# Patient Record
Sex: Female | Born: 1955 | Race: White | Hispanic: No | Marital: Married | Smoking: Former smoker
Health system: Southern US, Community
[De-identification: ages and names within clinical notes are randomized; demographics above are authoritative.]

## PROBLEM LIST (undated history)

## (undated) DIAGNOSIS — I10 Essential (primary) hypertension: Secondary | ICD-10-CM

## (undated) DIAGNOSIS — I714 Abdominal aortic aneurysm, without rupture, unspecified: Secondary | ICD-10-CM

## (undated) DIAGNOSIS — T7840XA Allergy, unspecified, initial encounter: Secondary | ICD-10-CM

## (undated) DIAGNOSIS — I6529 Occlusion and stenosis of unspecified carotid artery: Secondary | ICD-10-CM

## (undated) DIAGNOSIS — I701 Atherosclerosis of renal artery: Secondary | ICD-10-CM

## (undated) HISTORY — DX: Essential (primary) hypertension: I10

## (undated) HISTORY — DX: Abdominal aortic aneurysm, without rupture, unspecified: I71.40

## (undated) HISTORY — PX: EYE SURGERY: SHX253

## (undated) HISTORY — DX: Abdominal aortic aneurysm, without rupture: I71.4

## (undated) HISTORY — PX: TUBAL LIGATION: SHX77

## (undated) HISTORY — DX: Allergy, unspecified, initial encounter: T78.40XA

## (undated) HISTORY — DX: Occlusion and stenosis of unspecified carotid artery: I65.29

## (undated) HISTORY — DX: Atherosclerosis of renal artery: I70.1

---

## 2000-04-01 ENCOUNTER — Ambulatory Visit (HOSPITAL_COMMUNITY): Admission: RE | Admit: 2000-04-01 | Discharge: 2000-04-01 | Payer: Self-pay

## 2002-07-30 ENCOUNTER — Emergency Department (HOSPITAL_COMMUNITY): Admission: EM | Admit: 2002-07-30 | Discharge: 2002-07-31 | Payer: Self-pay | Admitting: Emergency Medicine

## 2004-08-25 ENCOUNTER — Ambulatory Visit (HOSPITAL_COMMUNITY): Admission: RE | Admit: 2004-08-25 | Discharge: 2004-08-25 | Payer: Self-pay | Admitting: Obstetrics and Gynecology

## 2004-08-29 ENCOUNTER — Encounter: Admission: RE | Admit: 2004-08-29 | Discharge: 2004-08-29 | Payer: Self-pay | Admitting: Obstetrics and Gynecology

## 2005-12-04 ENCOUNTER — Encounter: Admission: RE | Admit: 2005-12-04 | Discharge: 2005-12-04 | Payer: Self-pay | Admitting: Obstetrics and Gynecology

## 2006-12-13 ENCOUNTER — Ambulatory Visit: Payer: Self-pay | Admitting: Gastroenterology

## 2006-12-17 ENCOUNTER — Encounter: Admission: RE | Admit: 2006-12-17 | Discharge: 2006-12-17 | Payer: Self-pay | Admitting: Obstetrics and Gynecology

## 2006-12-21 ENCOUNTER — Ambulatory Visit: Payer: Self-pay | Admitting: Gastroenterology

## 2006-12-24 ENCOUNTER — Encounter: Admission: RE | Admit: 2006-12-24 | Discharge: 2006-12-24 | Payer: Self-pay | Admitting: Obstetrics and Gynecology

## 2010-12-21 ENCOUNTER — Encounter: Payer: Self-pay | Admitting: Obstetrics and Gynecology

## 2011-01-01 ENCOUNTER — Other Ambulatory Visit: Payer: Self-pay | Admitting: Obstetrics and Gynecology

## 2011-01-01 DIAGNOSIS — M8580 Other specified disorders of bone density and structure, unspecified site: Secondary | ICD-10-CM

## 2011-01-01 DIAGNOSIS — Z1239 Encounter for other screening for malignant neoplasm of breast: Secondary | ICD-10-CM

## 2011-01-13 ENCOUNTER — Ambulatory Visit
Admission: RE | Admit: 2011-01-13 | Discharge: 2011-01-13 | Disposition: A | Discharge status: Home / Self Care | Payer: BC Managed Care – PPO | Source: Ambulatory Visit | Attending: Obstetrics and Gynecology | Admitting: Obstetrics and Gynecology

## 2011-01-13 DIAGNOSIS — M8580 Other specified disorders of bone density and structure, unspecified site: Secondary | ICD-10-CM

## 2011-01-13 DIAGNOSIS — Z1239 Encounter for other screening for malignant neoplasm of breast: Secondary | ICD-10-CM

## 2014-10-13 ENCOUNTER — Ambulatory Visit (INDEPENDENT_AMBULATORY_CARE_PROVIDER_SITE_OTHER): Payer: BC Managed Care – PPO | Admitting: Internal Medicine

## 2014-10-13 ENCOUNTER — Ambulatory Visit (INDEPENDENT_AMBULATORY_CARE_PROVIDER_SITE_OTHER): Payer: BC Managed Care – PPO

## 2014-10-13 ENCOUNTER — Ambulatory Visit: Payer: BC Managed Care – PPO

## 2014-10-13 VITALS — BP 170/98 | HR 86 | Temp 98.7°F | Resp 15 | Ht 65.5 in | Wt 130.0 lb

## 2014-10-13 DIAGNOSIS — S40012A Contusion of left shoulder, initial encounter: Secondary | ICD-10-CM

## 2014-10-13 DIAGNOSIS — M25551 Pain in right hip: Secondary | ICD-10-CM

## 2014-10-13 DIAGNOSIS — M79642 Pain in left hand: Secondary | ICD-10-CM

## 2014-10-13 DIAGNOSIS — S62305A Unspecified fracture of fourth metacarpal bone, left hand, initial encounter for closed fracture: Secondary | ICD-10-CM

## 2014-10-13 DIAGNOSIS — S0083XA Contusion of other part of head, initial encounter: Secondary | ICD-10-CM

## 2014-10-13 DIAGNOSIS — S8002XA Contusion of left knee, initial encounter: Secondary | ICD-10-CM

## 2014-10-13 DIAGNOSIS — M7071 Other bursitis of hip, right hip: Secondary | ICD-10-CM

## 2014-10-13 MED ORDER — MELOXICAM 15 MG PO TABS: 15.0000 mg | ORAL_TABLET | Freq: Every day | ORAL | Status: DC

## 2014-10-13 NOTE — Progress Notes (Signed)
Subjective:  This chart was scribed for Pamela ChickKristi M Smith, MD by Pamela Garrett, ED Scribe. This patient was seen in room Room/bed info not found and the patient's care was started 8:52 AM.   Patient ID: Pamela Garrett, female    DOB: [birth year], 57 y.o.   MRN: [id number]  HPI HPI Comments: Pamela Garrett is a 58 y.o. female who presents to the Emergency Department complaining of a fall that occurred last night. Pt reports she tripped over a speed bumb last night and complains of left hand and left sided facial pain. Pt reports associated swelling to her left hand where she states that she tried to catch herself when she fell. Pt complains of pain in her left knee and left shoulder following the fall, but is able to walk without pain. She reports no other symptoms or modifying factors at this time.   In addition she has right hip pain with walking and laying on that side for the past 8/9 months. This has been a recurrent problem for the past 8 to 10 years but has worsened recently. 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 The right hip does not radiate from the lumbar area but does radiate from the hip to the knee.   There are no active problems to display for this patient.  Allergies  Allergen Reactions  . Sulfa Antibiotics Hives   No current outpatient prescriptions on file prior to visit.   No current facility-administered medications on file prior to visit.   History   Social History  . Marital Status: Married    Spouse Name: N/A    Number of Children: N/A  . Years of Education: N/A   Occupational History  . Not on file.   Social History Main Topics  . Smoking status: Current Every Day Smoker -- 1.00 packs/day    Types: Cigarettes    Start date: 10/13/1973  . Smokeless tobacco: Never Used  . Alcohol Use: Not on file  . Drug Use: Not on file  .  Sexual Activity: Not on file   Other Topics Concern  . Not on file   Social History Narrative  . No narrative on file   Filed Vitals:   10/13/14 0843  BP: 170/98  Pulse: 86  Temp: 98.7 F (37.1 C)  Resp: 15    Past Surgical History  Procedure Laterality Date  . Tubal ligation    .  Review of Systems  Musculoskeletal: Positive for arthralgias.  Neurological: Negative for syncope.  All other systems reviewed and are negative.     Objective:   Physical Exam  Constitutional: She is oriented to person, place, and time. She appears well-developed and well-nourished. No distress.  No acute distress.   HENT:  Head: Normocephalic and atraumatic.  Eyes: Conjunctivae and EOM are normal. Pupils are equal, round, and reactive to light.  Neck: Neck supple.  Cardiovascular: Normal rate.   Pulmonary/Chest: Effort normal.  Musculoskeletal:  Ecchymosis over the left malar area but good movement of the jaw and full EOMs.   Left hand is swollen over the dorsum along the 4th metacarpal with pain on palpation and ROM. MCP somewhat receded, but normal rotation w/ flexion  Contusion with abrasion over the left infrapatellar area with slight bruising but the knee is stable to stressors and has a full ROM without effusion.  Left shoulder has contusion over the anterior aspect with minimal swelling with some pain on ROM but with full movement against resistance.   Right hip tender over the greater trochanter and into the lateral thigh to palpation. External rotation creates pain there is some tenderness over the right SI join. Straight leg raises negative to 90 degrees. No periphery or sensory losses on the right.   Neurological: She is alert and oriented to person, place, and time. No cranial nerve deficit.  Psychiatric: She has a normal mood and affect. Her behavior is normal.  Nursing note and vitals reviewed.   [facility] reading (PRIMARY) by  Pamela Garrett=?impacted fx 4th MC head without  significant angulation Hip=no degen changes//?what is Dense Ca++ body adjacent to iliac crest      Assessment & Plan:  Pain of left hand   Right hip pain - trochanteric bursitis with some iliotibial radiation of discomfort  meloxicam and stretching for 1 month//if not improving considering injections  Fracture of fourth metacarpal bone of left hand???  Splint with ulnar gutter and plan to see in clinic 104 next wednesday  Contusion of left shoulder, initial encounter  Contusion of face, initial encounter  Contusion of left knee, initial encounter      I have completed the patient encounter in its entirety as documented by the scribe, with editing by me where necessary. Pamela Garrett, M.D.

## 2014-10-15 ENCOUNTER — Encounter: Payer: Self-pay | Admitting: Physician Assistant

## 2014-10-15 NOTE — Progress Notes (Unsigned)
Patient presented with discomfort from the splint placed on the LEFT wrist.  New splint fashioned, requiring several attempts, for comfort.  She will follow-up with Dr. Merla Richesoolittle in 2 days as planned.

## 2014-10-17 ENCOUNTER — Encounter: Payer: Self-pay | Admitting: Family Medicine

## 2014-10-17 ENCOUNTER — Ambulatory Visit (INDEPENDENT_AMBULATORY_CARE_PROVIDER_SITE_OTHER): Payer: BC Managed Care – PPO | Admitting: Family Medicine

## 2014-10-17 ENCOUNTER — Ambulatory Visit (INDEPENDENT_AMBULATORY_CARE_PROVIDER_SITE_OTHER): Payer: BC Managed Care – PPO

## 2014-10-17 VITALS — BP 154/88 | HR 75 | Temp 98.6°F | Resp 16 | Ht 65.5 in | Wt 131.4 lb

## 2014-10-17 DIAGNOSIS — R8290 Unspecified abnormal findings in urine: Secondary | ICD-10-CM

## 2014-10-17 DIAGNOSIS — R8299 Other abnormal findings in urine: Secondary | ICD-10-CM

## 2014-10-17 DIAGNOSIS — S6992XD Unspecified injury of left wrist, hand and finger(s), subsequent encounter: Secondary | ICD-10-CM

## 2014-10-17 DIAGNOSIS — J3089 Other allergic rhinitis: Secondary | ICD-10-CM

## 2014-10-17 LAB — POCT URINALYSIS DIPSTICK
BILIRUBIN UA: NEGATIVE
Glucose, UA: NEGATIVE
Ketones, UA: 40
Nitrite, UA: POSITIVE
PROTEIN UA: NEGATIVE
Spec Grav, UA: 1.02
Urobilinogen, UA: 0.2
pH, UA: 5

## 2014-10-17 LAB — POCT UA - MICROSCOPIC ONLY
Casts, Ur, LPF, POC: NEGATIVE
Crystals, Ur, HPF, POC: NEGATIVE
Mucus, UA: POSITIVE
Yeast, UA: NEGATIVE

## 2014-10-17 MED ORDER — FLUTICASONE PROPIONATE 50 MCG/ACT NA SUSP: 2.0000 | Freq: Every day | NASAL | Status: DC

## 2014-10-17 NOTE — Patient Instructions (Addendum)
[facility] can see you on Tuesday Nov 24th (Dr Melvyn Novasrtmann) at 1 pm, arrive at 12:30.

## 2014-10-17 NOTE — Progress Notes (Signed)
Patient was placed in a new splint/ with her new diagnosis of base of 5th fracture, she should be splinted with her fingers in flexion. I placed her in a fiberglass splint with her wrist in neutral position and her fingers in flexion. There will be no charge for the supplies, as her previous splint could not be reused with fingers in extension.

## 2014-10-17 NOTE — Progress Notes (Signed)
Subjective:    Patient ID: Pamela Garrett, female    DOB: 10-04-1956, 58 y.o.   MRN: [id number]  HPI Patient presents today for follow up of left hand injury. Has been wearing splint every day and all night. Has noticed some redness in left thumb and forefinger. Has pain over lateral dorsum of hand. Pain not constant. Has not needed medication for pain. She has continued to work and uses her thumb and forefinger. Swelling has decreased considerably. Other injuries to left shoulder and left cheek and jaw are resolving  Has constant sinus headache and clear nasal drainage. Some relief with OTC zyrtec, but feels very dry. Takes [employer] Pow[employer] daily with some relief. Has never tried a nasal steroid.   [place] in [place] U[place] a couple of weeks ago and although her dysuria is resolved, her urine remains cloudy.    Review of Systems No fever, no chills, no dysuria/frequency/hematuria, no back pain. No ear pain.     Objective:   Physical Exam  Constitutional: She is oriented to person, place, and time. She appears well-developed and well-nourished.  HENT:  Head: Normocephalic and atraumatic.  Eyes: Conjunctivae are normal.  Neck: Normal range of motion. Neck supple.  Cardiovascular: Normal rate.   Pulmonary/Chest: Effort normal.  Musculoskeletal:       Left hand: She exhibits decreased range of motion, bony tenderness and swelling.  Left dorsum with mild swelling and resolving ecchymosis. Erythema on palm and thumb and forefinger. Decreased ROM of 4th finger.   Neurological: She is alert and oriented to person, place, and time.  Skin: Skin is warm.  Psychiatric: She has a normal mood and affect. Her behavior is normal. Judgment and thought content normal.  Vitals reviewed. BP 154/88 mmHg  Pulse 75  Temp(Src) 98.6 F (37 C) (Oral)  Resp 16  Ht 5' 5.5" (1.664 m)  Wt 131 lb 6.4 oz (59.603 kg)  BMI 21.53 kg/m2  SpO2 100%  [facility] reading (PRIMARY) by  Dr. Merla Richesoolittle- Left  hand compete- Suspect fracture at base of 5th MC, continue to suspect fracture of 4th MC.   POCT UA - Microscopic Only  Result Value Ref Range   WBC, Ur, HPF, POC 9-15    RBC, urine, microscopic 3-7    Bacteria, U Microscopic 3+    Mucus, UA pos    Epithelial cells, urine per micros 2-4    Crystals, Ur, HPF, POC neg    Casts, Ur, LPF, POC neg    Yeast, UA neg   POCT urinalysis dipstick  Result Value Ref Range   Color, UA yellow    Clarity, UA clear    Glucose, UA neg    Bilirubin, UA neg    Ketones, UA 40    Spec Grav, UA 1.020    Blood, UA large    pH, UA 5.0    Protein, UA neg    Urobilinogen, UA 0.2    Nitrite, UA pos    Leukocytes, UA Trace      Assessment & Plan:  1. Other allergic rhinitis - fluticasone (FLONASE) 50 MCG/ACT nasal spray; Place 2 sprays into both nostrils daily.  Dispense: 16 g; Refill: 6  2. Cloudy urine - POCT UA - Microscopic Only - POCT urinalysis dipstick - Urine with WBCs, Nitrite, Leukocytes- will check culture for sensitivity since patient recently on antibiotic, but not sure which one.  - Urine culture  3. Hand injury, left, subsequent encounter - DG Hand Complete Left; Future -  [facility] - New splint applied - Ambulatory referral to Orthopedic Surgery  Emi Belfasteborah B. Gessner, FNP-BC  [facility], [facility]  10/19/2014 11:01 PM

## 2014-10-20 LAB — URINE CULTURE

## 2014-10-21 ENCOUNTER — Other Ambulatory Visit: Payer: Self-pay | Admitting: Family Medicine

## 2014-10-21 DIAGNOSIS — N3090 Cystitis, unspecified without hematuria: Secondary | ICD-10-CM

## 2014-10-21 MED ORDER — NITROFURANTOIN MONOHYD MACRO 100 MG PO CAPS: 100.0000 mg | ORAL_CAPSULE | Freq: Two times a day (BID) | ORAL | Status: DC

## 2014-10-22 ENCOUNTER — Telehealth: Payer: Self-pay

## 2014-10-22 NOTE — Telephone Encounter (Signed)
Patient would like to know if she could get a note for work regarding her E. coli    [employer]#: [phone]

## 2014-10-22 NOTE — Telephone Encounter (Signed)
Pt advised her employer that she had E. Coli. Employer was concerned that pt was harboring a communicable disease. Note provided.

## 2014-11-14 ENCOUNTER — Encounter: Payer: Self-pay | Admitting: Family Medicine

## 2014-11-14 ENCOUNTER — Ambulatory Visit (INDEPENDENT_AMBULATORY_CARE_PROVIDER_SITE_OTHER): Payer: BC Managed Care – PPO | Admitting: Family Medicine

## 2014-11-14 VITALS — BP 155/84 | HR 83 | Temp 98.1°F | Resp 16 | Ht 65.25 in | Wt 127.0 lb

## 2014-11-14 DIAGNOSIS — R8290 Unspecified abnormal findings in urine: Secondary | ICD-10-CM

## 2014-11-14 DIAGNOSIS — N898 Other specified noninflammatory disorders of vagina: Secondary | ICD-10-CM

## 2014-11-14 DIAGNOSIS — M25551 Pain in right hip: Secondary | ICD-10-CM

## 2014-11-14 DIAGNOSIS — B9689 Other specified bacterial agents as the cause of diseases classified elsewhere: Secondary | ICD-10-CM

## 2014-11-14 DIAGNOSIS — N760 Acute vaginitis: Secondary | ICD-10-CM

## 2014-11-14 DIAGNOSIS — R634 Abnormal weight loss: Secondary | ICD-10-CM

## 2014-11-14 DIAGNOSIS — R8299 Other abnormal findings in urine: Secondary | ICD-10-CM

## 2014-11-14 DIAGNOSIS — A499 Bacterial infection, unspecified: Secondary | ICD-10-CM

## 2014-11-14 LAB — POCT URINALYSIS DIPSTICK
GLUCOSE UA: NEGATIVE
KETONES UA: NEGATIVE
LEUKOCYTES UA: NEGATIVE
Nitrite, UA: NEGATIVE
PROTEIN UA: NEGATIVE
Urobilinogen, UA: 1
pH, UA: 5

## 2014-11-14 LAB — POCT WET PREP WITH KOH
KOH Prep POC: NEGATIVE
RBC WET PREP PER HPF POC: NEGATIVE
TRICHOMONAS UA: NEGATIVE
YEAST WET PREP PER HPF POC: NEGATIVE

## 2014-11-14 LAB — POCT UA - MICROSCOPIC ONLY
BACTERIA, U MICROSCOPIC: NEGATIVE
CASTS, UR, LPF, POC: NEGATIVE
Crystals, Ur, HPF, POC: NEGATIVE
MUCUS UA: NEGATIVE
YEAST UA: NEGATIVE

## 2014-11-14 LAB — CBC
HEMATOCRIT: 39.9 % (ref 36.0–46.0)
Hemoglobin: 13.5 g/dL (ref 12.0–15.0)
MCH: 30.7 pg (ref 26.0–34.0)
MCHC: 33.8 g/dL (ref 30.0–36.0)
MCV: 90.7 fL (ref 78.0–100.0)
MPV: 10.7 fL (ref 9.4–12.4)
Platelets: 203 10*3/uL (ref 150–400)
RBC: 4.4 MIL/uL (ref 3.87–5.11)
RDW: 13.2 % (ref 11.5–15.5)
WBC: 5.5 10*3/uL (ref 4.0–10.5)

## 2014-11-14 MED ORDER — METRONIDAZOLE 0.75 % VA GEL: VAGINAL | Status: DC

## 2014-11-14 NOTE — Patient Instructions (Signed)
Take meloxicam daily Try heat and massage with gentle ROM  Hip Bursitis Bursitis is a swelling and soreness (inflammation) of a fluid-filled sac (bursa). This sac overlies and protects the joints.  CAUSES   Injury.  Overuse of the muscles surrounding the joint.  Arthritis.  Gout.  Infection.  Cold weather.  Inadequate warm-up and conditioning prior to activities. The cause may not be known.  SYMPTOMS   Mild to severe irritation.  Tenderness and swelling over the outside of the hip.  Pain with motion of the hip.  If the bursa becomes infected, a fever may be present. Redness, tenderness, and warmth will develop over the hip. Symptoms usually lessen in 3 to 4 weeks with treatment, but can come back. TREATMENT If conservative treatment does not work, your caregiver may advise draining the bursa and injecting cortisone into the area. This may speed up the healing process. This may also be used as an initial treatment of choice. HOME CARE INSTRUCTIONS   Apply ice to the affected area for 15-20 minutes every 3 to 4 hours while awake for the first 2 days. Put the ice in a plastic bag and place a towel between the bag of ice and your skin.  Rest the painful joint as much as possible, but continue to put the joint through a normal range of motion at least 4 times per day. When the pain lessens, begin normal, slow movements and usual activities to help prevent stiffness of the hip.  Only take over-the-counter or prescription medicines for pain, discomfort, or fever as directed by your caregiver.  Use crutches to limit weight bearing on the hip joint, if advised.  Elevate your painful hip to reduce swelling. Use pillows for propping and cushioning your legs and hips.  Gentle massage may provide comfort and decrease swelling. SEEK IMMEDIATE MEDICAL CARE IF:   Your pain increases even during treatment, or you are not improving.  You have a fever.  You have heat and inflammation  over the involved bursa.  You have any other questions or concerns. MAKE SURE YOU:   Understand these instructions.  Will watch your condition.  Will get help right away if you are not doing well or get worse. Document Released: 05/08/2002 Document Revised: 02/08/2012 Document Reviewed: 12/05/2008 [facility] Patient Information 2015 [place]. This information is not intended to replace advice given to you by your health care provider. Make sure you discuss any questions you have with your health care provider.

## 2014-11-14 NOTE — Progress Notes (Signed)
Subjective:    Patient ID: Pamela Garrett, female    DOB: [birth year], 58 y.o.   MRN: [id number]  HPI Patient presents today for follow up of UTI. She had finished her macrobid and then the next day, she had a red rash all over. She went to another UC and was given a steroid injection with resolution of rash.   Her urinary symptoms have resolved, but she wonders if she has a yeast infection. She took a Diflucan with some resolution of vaginal discharge, but continues to have some irritation.  She has had a 30 pound weight loss since this summer. She was initially trying to lose weight but has not been trying for the last 6 pounds. She reports decreased appetite, but eating regularly. She has some stress with her daughter and grandchildren living with her.   Her left hand fracture is healing, she is being followed by ortho and wearing a removable cast. Swelling and discoloration have decreased significantly.   She continues to have right hip pain. She was diagnosed with bursitis and was prescribed meloxicam for this, but never took it. Pain is worse with sitting. Pain into buttock and on side. No recent falls, no weakness, no radiation of pain down leg or up back. She is interested in an injection.   Review of Systems  Constitutional: Positive for appetite change and unexpected weight change. Negative for fever and chills.  Respiratory: Negative for cough, choking, chest tightness, shortness of breath and wheezing.   Cardiovascular: Negative for chest pain and leg swelling.  Gastrointestinal: Negative for nausea, vomiting, abdominal pain, diarrhea and constipation.  Genitourinary: Positive for vaginal discharge. Negative for dysuria, frequency and hematuria.  Musculoskeletal: Positive for arthralgias.      Objective:   Physical Exam  Constitutional: She is oriented to person, place, and time. She appears well-developed and well-nourished.  HENT:  Head: Normocephalic and atraumatic.    Eyes: Conjunctivae are normal.  Neck: Normal range of motion. Neck supple.  Cardiovascular: Normal rate, regular rhythm and normal heart sounds.   Pulmonary/Chest: Effort normal and breath sounds normal.  Abdominal: Soft. Bowel sounds are normal. She exhibits no distension and no mass. There is no tenderness. There is no rebound and no guarding.  Musculoskeletal: Normal range of motion. She exhibits tenderness. She exhibits no edema.       Right hip: She exhibits tenderness and bony tenderness. She exhibits normal range of motion, normal strength, no swelling, no crepitus and no deformity.  Pain with adduction, pain with palpation of gluteus maximus.  Neurological: She is alert and oriented to person, place, and time.  Skin: Skin is warm and dry.  Psychiatric: She has a normal mood and affect. Her behavior is normal. Judgment and thought content normal.  Vitals reviewed. BP 155/84 mmHg  Pulse 83  Temp(Src) 98.1 F (36.7 C) (Oral)  Resp 16  Ht 5' 5.25" (1.657 m)  Wt 127 lb (57.607 kg)  BMI 20.98 kg/m2  SpO2 98%    Assessment & Plan:  1. Vaginal discharge - POCT Wet Prep with KOH  2. Right hip pain - Encouraged patient to take meloxicam, use heat, massage. Offered her an ortho referral for possible injection, but patient reports that she has an appointment for follow up of her hand and will discuss then.   3. Loss of weight - CBC - Comprehensive metabolic panel - TSH - Follow up in 1 month  4. BV (bacterial vaginosis) - metroNIDAZOLE (METROGEL VAGINAL) 0.75 % vaginal  gel; 1 applicatorful in vagina at bedtime for 7 days.  Dispense: 70 g; Refill: 0  5. Cloudy urine - POCT UA - Microscopic Only - POCT urinalysis dipstick   Emi Belfasteborah B. Berkleigh Beckles, FNP-BC  [facility], [facility]  11/15/2014 4:56 PM

## 2014-11-15 LAB — COMPREHENSIVE METABOLIC PANEL
ALBUMIN: 4.2 g/dL (ref 3.5–5.2)
ALT: 13 U/L (ref 0–35)
AST: 12 U/L (ref 0–37)
Alkaline Phosphatase: 125 U/L — ABNORMAL HIGH (ref 39–117)
BUN: 19 mg/dL (ref 6–23)
CALCIUM: 9.4 mg/dL (ref 8.4–10.5)
CHLORIDE: 107 meq/L (ref 96–112)
CO2: 25 meq/L (ref 19–32)
Creat: 0.64 mg/dL (ref 0.50–1.10)
Glucose, Bld: 84 mg/dL (ref 70–99)
POTASSIUM: 4.1 meq/L (ref 3.5–5.3)
SODIUM: 138 meq/L (ref 135–145)
Total Bilirubin: 0.8 mg/dL (ref 0.2–1.2)
Total Protein: 6.5 g/dL (ref 6.0–8.3)

## 2014-11-15 LAB — TSH: TSH: 3.12 u[IU]/mL (ref 0.350–4.500)

## 2015-01-07 ENCOUNTER — Ambulatory Visit (INDEPENDENT_AMBULATORY_CARE_PROVIDER_SITE_OTHER): Payer: BC Managed Care – PPO | Admitting: Family Medicine

## 2015-01-07 ENCOUNTER — Encounter: Payer: Self-pay | Admitting: Family Medicine

## 2015-01-07 VITALS — BP 153/83 | HR 73 | Temp 98.3°F | Resp 16 | Ht 65.5 in | Wt 131.0 lb

## 2015-01-07 DIAGNOSIS — N898 Other specified noninflammatory disorders of vagina: Secondary | ICD-10-CM

## 2015-01-07 DIAGNOSIS — Z124 Encounter for screening for malignant neoplasm of cervix: Secondary | ICD-10-CM

## 2015-01-07 DIAGNOSIS — Z1239 Encounter for other screening for malignant neoplasm of breast: Secondary | ICD-10-CM

## 2015-01-07 DIAGNOSIS — Z0000 Encounter for general adult medical examination without abnormal findings: Secondary | ICD-10-CM

## 2015-01-07 DIAGNOSIS — F17200 Nicotine dependence, unspecified, uncomplicated: Secondary | ICD-10-CM

## 2015-01-07 DIAGNOSIS — I10 Essential (primary) hypertension: Secondary | ICD-10-CM

## 2015-01-07 DIAGNOSIS — R030 Elevated blood-pressure reading, without diagnosis of hypertension: Secondary | ICD-10-CM

## 2015-01-07 DIAGNOSIS — M8580 Other specified disorders of bone density and structure, unspecified site: Secondary | ICD-10-CM

## 2015-01-07 DIAGNOSIS — Z113 Encounter for screening for infections with a predominantly sexual mode of transmission: Secondary | ICD-10-CM

## 2015-01-07 DIAGNOSIS — IMO0001 Reserved for inherently not codable concepts without codable children: Secondary | ICD-10-CM

## 2015-01-07 DIAGNOSIS — Z87898 Personal history of other specified conditions: Secondary | ICD-10-CM

## 2015-01-07 DIAGNOSIS — Z720 Tobacco use: Secondary | ICD-10-CM

## 2015-01-07 DIAGNOSIS — Z9189 Other specified personal risk factors, not elsewhere classified: Secondary | ICD-10-CM

## 2015-01-07 LAB — POCT WET PREP WITH KOH
CLUE CELLS WET PREP PER HPF POC: NEGATIVE
KOH PREP POC: NEGATIVE
Trichomonas, UA: NEGATIVE
YEAST WET PREP PER HPF POC: NEGATIVE

## 2015-01-07 LAB — HEPATITIS C ANTIBODY: HCV AB: NEGATIVE

## 2015-01-07 LAB — RPR

## 2015-01-07 MED ORDER — HYDROCHLOROTHIAZIDE 25 MG PO TABS: 25.0000 mg | ORAL_TABLET | Freq: Every day | ORAL | Status: DC

## 2015-01-07 NOTE — Progress Notes (Signed)
Subjective:    Patient ID: Pamela Garrett, female    DOB: [birth year], 59 y.o.   MRN: [id number]  HPI Patient presents today for CPE. She has been doing well.   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 They should be moving out soon. 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 She has little time for herself. She dated some in the fall and requests STD testing today.   She has smoked 1ppd for many years. Is not currently interested in quitting.   Past Medical History  Diagnosis Date  . Allergy    Past Surgical History  Procedure Laterality Date  . Tubal ligation     No family history on file. History  Substance Use Topics  . Smoking status: Current Every Day Smoker -- 1.00 packs/day    Types: Cigarettes    Start date: 10/13/1973  . Smokeless tobacco: Never Used  . Alcohol Use: No    Review of Systems  Constitutional: Negative.   HENT: Negative.   Eyes: Negative.   Respiratory: Negative.   Cardiovascular: Negative.   Gastrointestinal: Negative.   Endocrine: Negative.   Genitourinary: Positive for vaginal discharge.  Musculoskeletal: Negative.   Skin: Negative.   Allergic/Immunologic: Negative.   Neurological: Negative.   Hematological: Negative.   Psychiatric/Behavioral: Negative.       Objective:   Physical Exam  Constitutional: She is oriented to person, place, and time. She appears well-developed and well-nourished.  HENT:  Head: Normocephalic and atraumatic.  Right Ear: External ear normal.  Left Ear: External ear normal.  Nose: Nose normal.  Mouth/Throat: Oropharynx is clear and moist. No oropharyngeal exudate.  Eyes: Conjunctivae are normal. Pupils are equal, round, and reactive to light.  Neck: Normal  range of motion. Neck supple. No thyromegaly present.  Cardiovascular: Normal rate, regular rhythm and normal heart sounds.   Pulmonary/Chest: Effort normal and breath sounds normal.  Abdominal: Soft. Bowel sounds are normal. She exhibits no distension and no mass. There is no tenderness. There is no rebound and no guarding. Hernia confirmed negative in the right inguinal area and confirmed negative in the left inguinal area.  Genitourinary: Uterus normal. No breast swelling, tenderness, discharge or bleeding. Pelvic exam was performed with patient supine. There is no rash, tenderness, lesion or injury on the right labia. There is no rash, tenderness, lesion or injury on the left labia. Cervix exhibits no motion tenderness, no discharge and no friability. Right adnexum displays no mass, no tenderness and no fullness. Left adnexum displays no mass, no tenderness and no fullness. No erythema, tenderness or bleeding in the vagina. Vaginal discharge (thin, white, nonodorous) found.  Musculoskeletal: Normal range of motion.  Lymphadenopathy:    She has no cervical adenopathy.       Right: No inguinal adenopathy present.       Left: No inguinal adenopathy present.  Neurological: She is alert and oriented to person, place, and time. She has normal reflexes.  Skin: Skin is warm and dry.  Psychiatric: She has a normal mood and affect. Her behavior is normal. Judgment and thought content normal.  Vitals reviewed.   BP 153/83 mmHg  Pulse 73  Temp(Src) 98.3 F (36.8 C) (Oral)  Resp 16  Ht  5' 5.5" (1.664 m)  Wt 131 lb (59.421 kg)  BMI 21.46 kg/m2  SpO2 99%  Wet prep- negative for yeast, BV, trich    Assessment & Plan:  1. Annual physical exam -patient had normal labs (CBC, CMET, TSH, UA) 11/14/14 -Discussed healthy habits, encouraged smoking cessation  2. Vaginal discharge - POCT Wet Prep with KOH- negative - encouraged natural fiber underwear, loose fitting clothing, soap without  fragrance  3. Essential hypertension - patient very reluctant to take medication. Discussed potential harm from untreated HTN - hydrochlorothiazide (HYDRODIURIL) 25 MG tablet; Take 1 tablet (25 mg total) by mouth daily.  Dispense: 90 tablet; Refill: 3 - Follow up BP check only in 2 weeks  4. Screening for cervical cancer - Pap IG, CT/NG w/ reflex HPV when ASC-U  5. Routine screening for STI (sexually transmitted infection) - HIV antibody - RPR - Hepatitis C antibody  6. At risk for bone density loss - DG Bone Density; Future  7. Screening for breast cancer - MM Digital Screening; Future   Emi Belfast, FNP-BC  [facility], [facility]  01/10/2015 1:54 PM

## 2015-01-07 NOTE — Patient Instructions (Signed)
Consider quitting smoking- you can do it! Pick a stop date, make a plan.  For high blood pressure- take 1/2 tablet daily for 1 week, then increase to 1 tablet. Come in for blood pressure check in 2 weeks- no appointment needed   Smoking Cessation Quitting smoking is important to your health and has many advantages. However, it is not always easy to quit since nicotine is a very addictive drug. Oftentimes, people try 3 times or more before being able to quit. This document explains the best ways for you to prepare to quit smoking. Quitting takes hard work and a lot of effort, but you can do it. ADVANTAGES OF QUITTING SMOKING  You will live longer, feel better, and live better.  Your body will feel the impact of quitting smoking almost immediately.  Within 20 minutes, blood pressure decreases. Your pulse returns to its normal level.  After 8 hours, carbon monoxide levels in the blood return to normal. Your oxygen level increases.  After 24 hours, the chance of having a heart attack starts to decrease. Your breath, hair, and body stop smelling like smoke.  After 48 hours, damaged nerve endings begin to recover. Your sense of taste and smell improve.  After 72 hours, the body is virtually free of nicotine. Your bronchial tubes relax and breathing becomes easier.  After 2 to 12 weeks, lungs can hold more air. Exercise becomes easier and circulation improves.  The risk of having a heart attack, stroke, cancer, or lung disease is greatly reduced.  After 1 year, the risk of coronary heart disease is cut in half.  After 5 years, the risk of stroke falls to the same as a nonsmoker.  After 10 years, the risk of lung cancer is cut in half and the risk of other cancers decreases significantly.  After 15 years, the risk of coronary heart disease drops, usually to the level of a nonsmoker.  If you are pregnant, quitting smoking will improve your chances of having a healthy baby.  The people  you live with, especially any children, will be healthier.  You will have extra money to spend on things other than cigarettes. QUESTIONS TO THINK ABOUT BEFORE ATTEMPTING TO QUIT You may want to talk about your answers with your health care provider.  Why do you want to quit?  If you tried to quit in the past, what helped and what did not?  What will be the most difficult situations for you after you quit? How will you plan to handle them?  Who can help you through the tough times? Your family? Friends? A health care provider?  What pleasures do you get from smoking? What ways can you still get pleasure if you quit? Here are some questions to ask your health care provider:  How can you help me to be successful at quitting?  What medicine do you think would be best for me and how should I take it?  What should I do if I need more help?  What is smoking withdrawal like? How can I get information on withdrawal? GET READY  Set a quit date.  Change your environment by getting rid of all cigarettes, ashtrays, matches, and lighters in your home, car, or work. Do not let people smoke in your home.  Review your past attempts to quit. Think about what worked and what did not. GET SUPPORT AND ENCOURAGEMENT You have a better chance of being successful if you have help. You can get support in  many ways.  Tell your family, friends, and coworkers that you are going to quit and need their support. Ask them not to smoke around you.  Get individual, group, or telephone counseling and support. Programs are available at [employer] and health centers. Call your local health department for information about programs in your area.  Spiritual beliefs and practices may help some smokers quit.  Download a "quit meter" on your computer to keep track of quit statistics, such as how long you have gone without smoking, cigarettes not smoked, and money saved.  Get a self-help book about quitting  smoking and staying off tobacco. LEARN NEW SKILLS AND BEHAVIORS  Distract yourself from urges to smoke. Talk to someone, go for a walk, or occupy your time with a task.  Change your normal routine. Take a different route to work. Drink tea instead of coffee. Eat breakfast in a different place.  Reduce your stress. Take a hot bath, exercise, or read a book.  Plan something enjoyable to do every day. Reward yourself for not smoking.  Explore interactive web-based programs that specialize in helping you quit. GET MEDICINE AND USE IT CORRECTLY Medicines can help you stop smoking and decrease the urge to smoke. Combining medicine with the above behavioral methods and support can greatly increase your chances of successfully quitting smoking.  Nicotine replacement therapy helps deliver nicotine to your body without the negative effects and risks of smoking. Nicotine replacement therapy includes nicotine gum, lozenges, inhalers, nasal sprays, and skin patches. Some may be available over-the-counter and others require a prescription.  Antidepressant medicine helps people abstain from smoking, but how this works is unknown. This medicine is available by prescription.  Nicotinic receptor partial agonist medicine simulates the effect of nicotine in your brain. This medicine is available by prescription. Ask your health care provider for advice about which medicines to use and how to use them based on your health history. Your health care provider will tell you what side effects to look out for if you choose to be on a medicine or therapy. Carefully read the information on the package. Do not use any other product containing nicotine while using a nicotine replacement product.  RELAPSE OR DIFFICULT SITUATIONS Most relapses occur within the first 3 months after quitting. Do not be discouraged if you start smoking again. Remember, most people try several times before finally quitting. You may have symptoms  of withdrawal because your body is used to nicotine. You may crave cigarettes, be irritable, feel very hungry, cough often, get headaches, or have difficulty concentrating. The withdrawal symptoms are only temporary. They are strongest when you first quit, but they will go away within 10-14 days. To reduce the chances of relapse, try to:  Avoid drinking alcohol. Drinking lowers your chances of successfully quitting.  Reduce the amount of caffeine you consume. Once you quit smoking, the amount of caffeine in your body increases and can give you symptoms, such as a rapid heartbeat, sweating, and anxiety.  Avoid smokers because they can make you want to smoke.  Do not let weight gain distract you. Many smokers will gain weight when they quit, usually less than 10 pounds. Eat a healthy diet and stay active. You can always lose the weight gained after you quit.  Find ways to improve your mood other than smoking. FOR MORE INFORMATION  www.smokefree.gov  Document Released: 11/10/2001 Document Revised: 04/02/2014 Document Reviewed: 02/25/2012 [facility] Patient Information 2015 [place], [place]. This information is not intended to  replace advice given to you by your health care provider. Make sure you discuss any questions you have with your health care provider. Hypertension Hypertension, commonly called high blood pressure, is when the force of blood pumping through your arteries is too strong. Your arteries are the blood vessels that carry blood from your heart throughout your body. A blood pressure reading consists of a higher number over a lower number, such as 110/72. The higher number (systolic) is the pressure inside your arteries when your heart pumps. The lower number (diastolic) is the pressure inside your arteries when your heart relaxes. Ideally you want your blood pressure below 120/80. Hypertension forces your heart to work harder to pump blood. Your arteries may become narrow or stiff. Having  hypertension puts you at risk for heart disease, stroke, and other problems.  RISK FACTORS Some risk factors for high blood pressure are controllable. Others are not.  Risk factors you cannot control include:   Race. You may be at higher risk if you are African American.  Age. Risk increases with age.  Gender. Men are at higher risk than women before age 19 years. After age 65, women are at higher risk than men. Risk factors you can control include:  Not getting enough exercise or physical activity.  Being overweight.  Getting too much fat, sugar, calories, or salt in your diet.  Drinking too much alcohol. SIGNS AND SYMPTOMS Hypertension does not usually cause signs or symptoms. Extremely high blood pressure (hypertensive crisis) may cause headache, anxiety, shortness of breath, and nosebleed. DIAGNOSIS  To check if you have hypertension, your health care provider will measure your blood pressure while you are seated, with your arm held at the level of your heart. It should be measured at least twice using the same arm. Certain conditions can cause a difference in blood pressure between your right and left arms. A blood pressure reading that is higher than normal on one occasion does not mean that you need treatment. If one blood pressure reading is high, ask your health care provider about having it checked again. TREATMENT  Treating high blood pressure includes making lifestyle changes and possibly taking medicine. Living a healthy lifestyle can help lower high blood pressure. You may need to change some of your habits. Lifestyle changes may include:  Following the DASH diet. This diet is high in fruits, vegetables, and whole grains. It is low in salt, red meat, and added sugars.  Getting at least 2 hours of brisk physical activity every week.  Losing weight if necessary.  Not smoking.  Limiting alcoholic beverages.  Learning ways to reduce stress. If lifestyle changes are  not enough to get your blood pressure under control, your health care provider may prescribe medicine. You may need to take more than one. Work closely with your health care provider to understand the risks and benefits. HOME CARE INSTRUCTIONS  Have your blood pressure rechecked as directed by your health care provider.   Take medicines only as directed by your health care provider. Follow the directions carefully. Blood pressure medicines must be taken as prescribed. The medicine does not work as well when you skip doses. Skipping doses also puts you at risk for problems.   Do not smoke.   Monitor your blood pressure at home as directed by your health care provider. SEEK MEDICAL CARE IF:   You think you are having a reaction to medicines taken.  You have recurrent headaches or feel dizzy.  You have  swelling in your ankles.  You have trouble with your vision. SEEK IMMEDIATE MEDICAL CARE IF:  You develop a severe headache or confusion.  You have unusual weakness, numbness, or feel faint.  You have severe chest or abdominal pain.  You vomit repeatedly.  You have trouble breathing. MAKE SURE YOU:   Understand these instructions.  Will watch your condition.  Will get help right away if you are not doing well or get worse. Document Released: 11/16/2005 Document Revised: 04/02/2014 Document Reviewed: 09/08/2013 [facility] Patient Information 2015 [place]. This information is not intended to replace advice given to you by your health care provider. Make sure you discuss any questions you have with your health care provider.

## 2015-01-08 ENCOUNTER — Encounter: Payer: Self-pay | Admitting: Family Medicine

## 2015-01-08 ENCOUNTER — Telehealth: Payer: Self-pay

## 2015-01-08 LAB — PAP IG, CT-NG, RFX HPV ASCU
CHLAMYDIA PROBE AMP: NEGATIVE
GC Probe Amp: NEGATIVE

## 2015-01-08 LAB — HIV ANTIBODY (ROUTINE TESTING W REFLEX): HIV 1&2 Ab, 4th Generation: NONREACTIVE

## 2015-01-08 NOTE — Telephone Encounter (Signed)
Pt would like to know if she should still take the bp medication prescribed by Deboraha Sprangebbie Gessner, she states that her bp was taken today and had come down to 134/84. Best# [phone]

## 2015-01-10 DIAGNOSIS — IMO0001 Reserved for inherently not codable concepts without codable children: Secondary | ICD-10-CM | POA: Insufficient documentation

## 2015-01-10 DIAGNOSIS — F17200 Nicotine dependence, unspecified, uncomplicated: Secondary | ICD-10-CM | POA: Insufficient documentation

## 2015-01-10 DIAGNOSIS — R030 Elevated blood-pressure reading, without diagnosis of hypertension: Secondary | ICD-10-CM

## 2015-01-17 NOTE — Addendum Note (Signed)
Addended by: Johnnette LitterARDWELL, Greogry Goodwyn M on: 01/17/2015 09:45 AM   Modules accepted: Orders, SmartSet

## 2015-02-01 ENCOUNTER — Encounter: Payer: Self-pay | Admitting: Family Medicine

## 2016-06-12 DIAGNOSIS — R319 Hematuria, unspecified: Secondary | ICD-10-CM | POA: Insufficient documentation

## 2016-06-12 DIAGNOSIS — R399 Unspecified symptoms and signs involving the genitourinary system: Secondary | ICD-10-CM | POA: Insufficient documentation

## 2016-06-12 DIAGNOSIS — N898 Other specified noninflammatory disorders of vagina: Secondary | ICD-10-CM | POA: Insufficient documentation

## 2016-10-01 ENCOUNTER — Encounter: Payer: Self-pay | Admitting: Gastroenterology

## 2016-10-21 ENCOUNTER — Encounter: Payer: Self-pay | Admitting: Gastroenterology

## 2016-12-01 DIAGNOSIS — R030 Elevated blood-pressure reading, without diagnosis of hypertension: Secondary | ICD-10-CM | POA: Insufficient documentation

## 2016-12-01 DIAGNOSIS — R05 Cough: Secondary | ICD-10-CM | POA: Insufficient documentation

## 2016-12-01 DIAGNOSIS — J329 Chronic sinusitis, unspecified: Secondary | ICD-10-CM | POA: Insufficient documentation

## 2016-12-01 DIAGNOSIS — R059 Cough, unspecified: Secondary | ICD-10-CM | POA: Insufficient documentation

## 2016-12-11 DIAGNOSIS — B373 Candidiasis of vulva and vagina: Secondary | ICD-10-CM | POA: Insufficient documentation

## 2016-12-11 DIAGNOSIS — B3731 Acute candidiasis of vulva and vagina: Secondary | ICD-10-CM | POA: Insufficient documentation

## 2017-06-03 DIAGNOSIS — R198 Other specified symptoms and signs involving the digestive system and abdomen: Secondary | ICD-10-CM | POA: Insufficient documentation

## 2017-06-03 DIAGNOSIS — R1013 Epigastric pain: Secondary | ICD-10-CM | POA: Insufficient documentation

## 2017-06-03 DIAGNOSIS — R194 Change in bowel habit: Secondary | ICD-10-CM | POA: Insufficient documentation

## 2017-06-03 DIAGNOSIS — K582 Mixed irritable bowel syndrome: Secondary | ICD-10-CM | POA: Insufficient documentation

## 2017-06-03 DIAGNOSIS — R0989 Other specified symptoms and signs involving the circulatory and respiratory systems: Secondary | ICD-10-CM | POA: Insufficient documentation

## 2017-07-05 DIAGNOSIS — I10 Essential (primary) hypertension: Secondary | ICD-10-CM | POA: Insufficient documentation

## 2017-07-05 DIAGNOSIS — I70203 Unspecified atherosclerosis of native arteries of extremities, bilateral legs: Secondary | ICD-10-CM | POA: Insufficient documentation

## 2017-07-05 DIAGNOSIS — I714 Abdominal aortic aneurysm, without rupture, unspecified: Secondary | ICD-10-CM | POA: Insufficient documentation

## 2017-07-05 DIAGNOSIS — R0989 Other specified symptoms and signs involving the circulatory and respiratory systems: Secondary | ICD-10-CM | POA: Insufficient documentation

## 2017-07-05 DIAGNOSIS — I701 Atherosclerosis of renal artery: Secondary | ICD-10-CM | POA: Insufficient documentation

## 2018-01-28 ENCOUNTER — Other Ambulatory Visit: Payer: Self-pay

## 2018-01-28 ENCOUNTER — Encounter (HOSPITAL_BASED_OUTPATIENT_CLINIC_OR_DEPARTMENT_OTHER): Payer: Self-pay | Admitting: Emergency Medicine

## 2018-01-28 ENCOUNTER — Emergency Department (HOSPITAL_BASED_OUTPATIENT_CLINIC_OR_DEPARTMENT_OTHER)
Admission: EM | Admit: 2018-01-28 | Discharge: 2018-01-28 | Disposition: A | Discharge status: Home / Self Care | Payer: BC Managed Care – PPO | Attending: Emergency Medicine | Admitting: Emergency Medicine

## 2018-01-28 ENCOUNTER — Emergency Department (HOSPITAL_BASED_OUTPATIENT_CLINIC_OR_DEPARTMENT_OTHER): Payer: BC Managed Care – PPO

## 2018-01-28 DIAGNOSIS — R51 Headache: Secondary | ICD-10-CM | POA: Insufficient documentation

## 2018-01-28 DIAGNOSIS — I160 Hypertensive urgency: Secondary | ICD-10-CM

## 2018-01-28 DIAGNOSIS — J0100 Acute maxillary sinusitis, unspecified: Secondary | ICD-10-CM

## 2018-01-28 DIAGNOSIS — Z79899 Other long term (current) drug therapy: Secondary | ICD-10-CM | POA: Insufficient documentation

## 2018-01-28 DIAGNOSIS — S62650A Nondisplaced fracture of medial phalanx of right index finger, initial encounter for closed fracture: Secondary | ICD-10-CM | POA: Diagnosis not present

## 2018-01-28 DIAGNOSIS — H538 Other visual disturbances: Secondary | ICD-10-CM | POA: Diagnosis not present

## 2018-01-28 DIAGNOSIS — I10 Essential (primary) hypertension: Secondary | ICD-10-CM | POA: Diagnosis present

## 2018-01-28 DIAGNOSIS — Y998 Other external cause status: Secondary | ICD-10-CM | POA: Diagnosis not present

## 2018-01-28 DIAGNOSIS — Y929 Unspecified place or not applicable: Secondary | ICD-10-CM | POA: Diagnosis not present

## 2018-01-28 DIAGNOSIS — X58XXXA Exposure to other specified factors, initial encounter: Secondary | ICD-10-CM | POA: Diagnosis not present

## 2018-01-28 DIAGNOSIS — Z87891 Personal history of nicotine dependence: Secondary | ICD-10-CM | POA: Diagnosis not present

## 2018-01-28 DIAGNOSIS — Y939 Activity, unspecified: Secondary | ICD-10-CM | POA: Diagnosis not present

## 2018-01-28 LAB — URINALYSIS, ROUTINE W REFLEX MICROSCOPIC
Bilirubin Urine: NEGATIVE
Glucose, UA: NEGATIVE mg/dL
Ketones, ur: NEGATIVE mg/dL
Nitrite: NEGATIVE
Protein, ur: NEGATIVE mg/dL
Specific Gravity, Urine: 1.02 (ref 1.005–1.030)
pH: 7.5 (ref 5.0–8.0)

## 2018-01-28 LAB — BASIC METABOLIC PANEL
Anion gap: 9 (ref 5–15)
BUN: 15 mg/dL (ref 6–20)
CO2: 25 mmol/L (ref 22–32)
Calcium: 9.2 mg/dL (ref 8.9–10.3)
Chloride: 106 mmol/L (ref 101–111)
Creatinine, Ser: 0.72 mg/dL (ref 0.44–1.00)
GFR calc Af Amer: >60 mL/min (ref >60)
GFR calc non Af Amer: >60 mL/min (ref >60)
Glucose, Bld: 93 mg/dL (ref 65–99)
Potassium: 3.6 mmol/L (ref 3.5–5.1)
Sodium: 140 mmol/L (ref 135–145)

## 2018-01-28 LAB — CBC WITH DIFFERENTIAL/PLATELET
Basophils Absolute: 0 10*3/uL (ref 0.0–0.1)
Basophils Relative: 1 %
Eosinophils Absolute: 0.1 10*3/uL (ref 0.0–0.7)
Eosinophils Relative: 2 %
HCT: 40.1 % (ref 36.0–46.0)
Hemoglobin: 13.4 g/dL (ref 12.0–15.0)
Lymphocytes Relative: 32 %
Lymphs Abs: 1.4 10*3/uL (ref 0.7–4.0)
MCH: 30.9 pg (ref 26.0–34.0)
MCHC: 33.4 g/dL (ref 30.0–36.0)
MCV: 92.6 fL (ref 78.0–100.0)
Monocytes Absolute: 0.5 10*3/uL (ref 0.1–1.0)
Monocytes Relative: 12 %
Neutro Abs: 2.4 10*3/uL (ref 1.7–7.7)
Neutrophils Relative %: 53 %
Platelets: 202 10*3/uL (ref 150–400)
RBC: 4.33 MIL/uL (ref 3.87–5.11)
RDW: 13 % (ref 11.5–15.5)
WBC: 4.4 10*3/uL (ref 4.0–10.5)

## 2018-01-28 LAB — URINALYSIS, MICROSCOPIC (REFLEX)

## 2018-01-28 MED ORDER — ACETAMINOPHEN 325 MG PO TABS: 650.0000 mg | ORAL_TABLET | Freq: Once | ORAL | Status: DC

## 2018-01-28 MED ORDER — FLUCONAZOLE 150 MG PO TABS: 150.0000 mg | ORAL_TABLET | Freq: Once | ORAL | 0 refills | Status: AC

## 2018-01-28 MED ORDER — AMOXICILLIN-POT CLAVULANATE 875-125 MG PO TABS: 1.0000 | ORAL_TABLET | Freq: Two times a day (BID) | ORAL | 0 refills | Status: DC

## 2018-01-28 MED ORDER — AMLODIPINE BESYLATE 5 MG PO TABS
5.0000 mg | ORAL_TABLET | Freq: Once | ORAL | Status: AC
  Administered 2018-01-28: 5 mg via ORAL
  Filled 2018-01-28: qty 1

## 2018-01-28 MED ORDER — ACETAMINOPHEN 160 MG/5ML PO SOLN
650.0000 mg | Freq: Once | ORAL | Status: AC
  Administered 2018-01-28: 650 mg via ORAL
  Filled 2018-01-28: qty 20.3

## 2018-01-28 MED ORDER — AMLODIPINE BESYLATE 5 MG PO TABS: 5.0000 mg | ORAL_TABLET | Freq: Every day | ORAL | 0 refills | Status: AC

## 2018-01-28 NOTE — Discharge Instructions (Signed)
Medications: Augmentin, amlodipine  Treatment: Take Augmentin twice daily for your sinus infection.  Begin taking amlodipine once daily for your blood pressure.  Follow-up: Please follow-up with your doctor next week for further evaluation and treatment of your blood pressure.  If you need follow-up to a hand doctor after seeing your primary care provider, you can call Dr. Amanda PeaGramig for further evaluation.  Please return to the emergency department if you develop any new or worsening symptoms.

## 2018-01-28 NOTE — ED Triage Notes (Addendum)
Reports had ultrasound this morning of her arteries.  States her systolic BP was over 200.  States she was referred to ER by heart and [facility] [place].  Denies history of HTN, states she is not currently taking medication for this.  Reports headache since today.  Denies chest pain, shortness of breath, dizziness.

## 2018-01-28 NOTE — ED Provider Notes (Signed)
[facility] EMERGENCY DEPARTMENT Provider Note   CSN: [id number] Arrival date & time: 01/28/18  1522     History   Chief Complaint Chief Complaint  Patient presents with  . Hypertension    HPI Pamela Garrett is a 62 y.o. female with history of tobacco use, however has quit since November 2018 who presents with hypertension.  Patient reports she was at the heart and vascular doctor getting biannual ultrasounds of her renal arteries, abdominal aorta, carotids when she was told she had elevated blood pressure.  She has never taken blood pressure medication before.  She reports she has had a mild headache today, but no other new symptoms.  She reports she is noticed some blurry vision for the past 6 months and thought she may just need to go to the eye doctor.  She denies numbness or tingling, weakness, chest pain, shortness of breath, abdominal pain, back pain, nausea, vomiting, or urinary symptoms.  HPI  Past Medical History:  Diagnosis Date  . Allergy     Patient Active Problem List   Diagnosis Date Noted  . Tobacco use disorder 01/10/2015  . Elevated blood pressure 01/10/2015    Past Surgical History:  Procedure Laterality Date  . TUBAL LIGATION      OB History    No data available       Home Medications    Prior to Admission medications   Medication Sig Start Date End Date Taking? Authorizing Provider  amLODipine (NORVASC) 5 MG tablet Take 1 tablet (5 mg total) by mouth daily. 01/28/18   Shray Hunley, Waylan BogaAlexandra M, PA-C  amoxicillin-clavulanate (AUGMENTIN) 875-125 MG tablet Take 1 tablet by mouth every 12 (twelve) hours. 01/28/18   Shloka Baldridge, Waylan BogaAlexandra M, PA-C  hydrochlorothiazide (HYDRODIURIL) 25 MG tablet Take 1 tablet (25 mg total) by mouth daily. 01/07/15   Emi BelfastGessner, Deborah B, FNP    Family History History reviewed. No pertinent family history.  Social History Social History   Tobacco Use  . Smoking status: Former Smoker    Packs/day: 1.00    Types: Cigarettes      Start date: 10/13/1973    Last attempt to quit: 09/30/2017    Years since quitting: 0.3  . Smokeless tobacco: Never Used  Substance Use Topics  . Alcohol use: No    Alcohol/week: 0.0 oz  . Drug use: No     Allergies   Sulfa antibiotics and Macrobid [nitrofurantoin]   Review of Systems Review of Systems  Constitutional: Negative for chills and fever.  HENT: Negative for facial swelling and sore throat.   Eyes: Positive for visual disturbance.  Respiratory: Negative for shortness of breath.   Cardiovascular: Negative for chest pain.  Gastrointestinal: Negative for abdominal pain, nausea and vomiting.  Genitourinary: Negative for dysuria.  Musculoskeletal: Negative for back pain.  Skin: Negative for rash and wound.  Neurological: Positive for headaches. Negative for weakness.  Psychiatric/Behavioral: The patient is not nervous/anxious.      Physical Exam Updated Vital Signs BP (!) 181/95 (BP Location: Right Arm)   Pulse (!) 58   Temp 98.1 F (36.7 C) (Oral)   Resp 19   Ht 5\' 5"  (1.651 m)   Wt 65.8 kg (145 lb)   SpO2 100%   BMI 24.13 kg/m   Physical Exam   ED Treatments / Results  Labs (all labs ordered are listed, but only abnormal results are displayed) Labs Reviewed  URINALYSIS, ROUTINE W REFLEX MICROSCOPIC - Abnormal; Notable for the following components:  Result Value   APPearance HAZY (*)    Hgb urine dipstick SMALL (*)    Leukocytes, UA TRACE (*)    All other components within normal limits  URINALYSIS, MICROSCOPIC (REFLEX) - Abnormal; Notable for the following components:   Bacteria, UA MANY (*)    Squamous Epithelial / LPF 0-5 (*)    All other components within normal limits  URINE CULTURE  BASIC METABOLIC PANEL  CBC WITH DIFFERENTIAL/PLATELET    EKG  EKG Interpretation  Date/Time:  Friday January 28 2018 15:32:00 EST Ventricular Rate:  67 PR Interval:  116 QRS Duration: 84 QT Interval:  434 QTC Calculation: 458 R Axis:   66 Text  Interpretation:  Normal sinus rhythm Nonspecific ST abnormality Abnormal ECG Confirmed by Tilden Fossa [phone]) on 01/28/2018 3:37:02 PM       Radiology Ct Head Wo Contrast  Result Date: 01/28/2018 CLINICAL DATA:  Chronic headaches.  Blurry vision for 6 months. EXAM: CT HEAD WITHOUT CONTRAST TECHNIQUE: Contiguous axial images were obtained from the base of the skull through the vertex without intravenous contrast. COMPARISON:  None. FINDINGS: Brain: No evidence of acute infarction, hemorrhage, hydrocephalus, extra-axial collection or mass lesion/mass effect. Vascular: No hyperdense vessel or unexpected calcification. Skull: Intact. Sinuses/Orbits: There is an air fluid level in the left maxillary sinus. Other: None. IMPRESSION: No acute intracranial abnormality. Air fluid left maxillary sinus compatible with acute sinusitis. Electronically Signed   By: Drusilla Kanner M.D.   On: 01/28/2018 19:20   Dg Finger Index Right  Result Date: 01/28/2018 CLINICAL DATA:  Chronic right index finger pain without known injury. EXAM: RIGHT INDEX FINGER 2+V COMPARISON:  None. FINDINGS: Small bone fragment is seen posterior to proximal interphalangeal joint concerning for fracture of indeterminate age. Joint spaces are intact. No other abnormality is noted. IMPRESSION: Small bone fragment seen posterior to proximal interphalangeal joint concerning for fracture of indeterminate age. Electronically Signed   By: Lupita Raider, M.D.   On: 01/28/2018 18:40    Procedures Procedures (including critical care time)  Medications Ordered in ED Medications  amLODipine (NORVASC) tablet 5 mg (5 mg Oral Given 01/28/18 1902)  acetaminophen (TYLENOL) solution 650 mg (650 mg Oral Given 01/28/18 2018)     Initial Impression / Assessment and Plan / ED Course  I have reviewed the triage vital signs and the nursing notes.  Pertinent labs & imaging results that were available during my care of the patient were reviewed by me and  considered in my medical decision making (see chart for details).     Patient with hypertension.  She has mild headache, however has had sinus pain for the past week and she feels this may be related.  CBC, BMP unremarkable.  Small hemoglobin, trace leukocytes, many bacteria in the urine, however otherwise unremarkable.  Patient denies any urinary symptoms.  Considering many bacteria and trace leukocytes, if culture positive, consider treatment, however no treatment at this time.  CT head is negative except for acute left maxillary sinusitis.  Will treat with Augmentin.  Patient requests Diflucan for prophylactic treatment for yeast infection.  Patient's blood pressure decreased in the ED.  Normal neuro exam without focal deficits.  I do not feel patient needs admission for hypertension emergency.  Will initiate Norvasc at home.  Patient also with several month history of right index finger pain.  X-ray shows small bone fragment seen posterior to PIP concerning for fracture of indeterminate age.  Patient placed in finger splint.  Patient to  follow-up with PCP for recheck of blood pressure and further treatment of blood pressure.  Patient to follow-up with PCP or hand surgery for further management of her finger pain.  Return precautions discussed.  Patient understands and agrees with plan.  Patient discharged in satisfactory condition. I discussed patient case with Dr. Madilyn Hook who guided the patient's management and agrees with plan.   Final Clinical Impressions(s) / ED Diagnoses   Final diagnoses:  Hypertensive urgency  Closed nondisplaced fracture of middle phalanx of right index finger, initial encounter  Acute maxillary sinusitis, recurrence not specified    ED Discharge Orders        Ordered    amLODipine (NORVASC) 5 MG tablet  Daily     01/28/18 2127    amoxicillin-clavulanate (AUGMENTIN) 875-125 MG tablet  Every 12 hours     01/28/18 2127    fluconazole (DIFLUCAN) 150 MG tablet   Once      01/28/18 2205       Emi Holes, PA-C 01/29/18 0008    Tilden Fossa, MD 01/29/18 [phone]

## 2018-01-31 LAB — URINE CULTURE
Culture: >=100000 — AB
Special Requests: NORMAL

## 2018-02-01 ENCOUNTER — Telehealth: Payer: Self-pay | Admitting: *Deleted

## 2018-02-01 NOTE — Telephone Encounter (Signed)
Post ED Visit - Positive Culture Follow-up  Culture report reviewed by antimicrobial stewardship pharmacist:  []  Enzo BiNathan Batchelder, Pharm.D. []  Celedonio MiyamotoJeremy Frens, [address]Ph[address].D., BCPS AQ-ID []  Garvin FilaMike Maccia, Pharm.D., BCPS []  Georgina PillionElizabeth Martin, Pharm.D., BCPS []  BillingsMinh Pham, [address]Pharm[address]D., BCPS, AAHIVP []  Estella HuskMichelle Turner, Pharm.D., BCPS, AAHIVP []  Lysle Pearlachel Rumbarger, PharmD, BCPS []  Blake DivineShannon Parkey, PharmD []  Pollyann SamplesAndy Johnston, PharmD, BCPS Adline PotterSabrina Dunham, PharmD  Positive urine culture Treated with Amoxicillin -Pot Clavulanate, organism sensitive to the same and no further patient follow-up is required at this time.  Virl AxeRobertson, Sawsan Riggio [facility] 02/01/2018, 10:40 AM

## 2018-02-01 NOTE — Progress Notes (Signed)
ED Antimicrobial Stewardship Positive Culture Follow Up   Bunnie DominoBarbara W Sermons is an 62 y.o. female who [facility] with a chief complaint of HTN and headache. Chief Complaint  Patient presents with  . Hypertension    Recent Results (from the past 720 hour(s))  Urine culture     Status: Abnormal   Collection Time: 01/28/18  6:24 PM  Result Value Ref Range Status   Specimen Description   Final    URINE, RANDOM [facility]formed at [facility] [place], [address] [place], [place] [address]    Special Requests   Final    Normal Performed at [facility] [place], [address] [place], [place] [address]    Culture >=100,000 COLONIES/mL ESCHERICHIA COLI (A)  Final   Report Status 01/31/2018 FINAL  Final   Organism ID, Bacteria ESCHERICHIA COLI (A)  Final      Susceptibility   Escherichia coli - MIC*    AMPICILLIN >=32 RESISTANT Resistant     CEFAZOLIN <=4 SENSITIVE Sensitive     CEFTRIAXONE <=1 SENSITIVE Sensitive     CIPROFLOXACIN <=0.25 SENSITIVE Sensitive     GENTAMICIN <=1 SENSITIVE Sensitive     IMIPENEM <=0.25 SENSITIVE Sensitive     NITROFURANTOIN <=16 SENSITIVE Sensitive     TRIMETH/SULFA <=20 SENSITIVE Sensitive     AMPICILLIN/SULBACTAM 16 INTERMEDIATE Intermediate     PIP/TAZO <=4 SENSITIVE Sensitive     Extended ESBL NEGATIVE Sensitive     * >=100,000 COLONIES/mL ESCHERICHIA COLI   Patient not treated for the UTI, but was treated for sinusitis with Augmentin. Spoke to patient, she states she is still experiencing symptoms of headache and congestion. She is comfortable following up with PCP this Friday. ED provider wishes to continue Augmentin in the meantime.   New antibiotic prescription: None  ED Provider: [place] Robinson, PA-C  Adline PotterSabrina Rollins Wrightson, PharmD Pharmacy Resident Pager: [phone]

## 2018-03-25 ENCOUNTER — Other Ambulatory Visit: Payer: Self-pay

## 2018-03-25 ENCOUNTER — Encounter: Payer: Self-pay | Admitting: Vascular Surgery

## 2018-03-25 ENCOUNTER — Encounter

## 2018-03-25 ENCOUNTER — Ambulatory Visit: Payer: BC Managed Care – PPO | Admitting: Vascular Surgery

## 2018-03-25 VITALS — BP 125/67 | HR 69 | Temp 97.0°F | Resp 16 | Ht 65.5 in | Wt 143.0 lb

## 2018-03-25 DIAGNOSIS — I701 Atherosclerosis of renal artery: Secondary | ICD-10-CM

## 2018-03-25 DIAGNOSIS — I6523 Occlusion and stenosis of bilateral carotid arteries: Secondary | ICD-10-CM

## 2018-03-25 DIAGNOSIS — I739 Peripheral vascular disease, unspecified: Secondary | ICD-10-CM | POA: Diagnosis not present

## 2018-03-25 DIAGNOSIS — I714 Abdominal aortic aneurysm, without rupture, unspecified: Secondary | ICD-10-CM

## 2018-03-25 MED ORDER — ROSUVASTATIN CALCIUM 10 MG PO TABS: 10.0000 mg | ORAL_TABLET | Freq: Every day | ORAL | 11 refills | Status: AC

## 2018-03-25 NOTE — Progress Notes (Signed)
Patient ID: Pamela Garrett, female   DOB: 02-Dec-1955, 62 y.o.   MRN: [id number]  Reason for Consult: New Patient (Initial Visit) (carotid stenosis)   Referred by Leone Payorruz, Nestor Jr., MD  Subjective:     HPI:  Pamela DominoBarbara W Keaney is a 62 y.o. female previous patient of Dr. Sondra Comeruz with known carotid artery stenosis, abdominal aortic aneurysm, bilateral renal artery stenosis, bilateral lower extremity pain.  She is a former smoker for 45 years but quit recently with the help of Chantix in November 2018.  She takes 4 baby aspirin a day does not take a statin drug.  She has no family history of aneurysm.  She has no personal or family history of stroke or TIA.  She is never had vascular surgery in the past.  She states that her thigh pain is exacerbated with walking she also has associated leg weakness after walking as well.  She does have a history of a ruptured disc in her back.  She does not have any history of DVT.  She does not take any blood thinners.  She does not have any worsening renal function.  Her blood pressure is well controlled with 2 medications currently.  She actually has no complaints relative to today's visit.  Past Medical History:  Diagnosis Date  . AAA (abdominal aortic aneurysm) (HCC)   . Allergy   . Atherosclerosis of renal artery (HCC)   . Carotid stenosis   . Hypertension    History reviewed. No pertinent family history. Past Surgical History:  Procedure Laterality Date  . TUBAL LIGATION      Short Social History:  Social History   Tobacco Use  . Smoking status: Former Smoker    Packs/day: 1.00    Types: Cigarettes    Start date: 10/13/1973    Last attempt to quit: 09/30/2017    Years since quitting: 0.4  . Smokeless tobacco: Never Used  Substance Use Topics  . Alcohol use: No    Alcohol/week: 0.0 oz    Allergies  Allergen Reactions  . Sulfa Antibiotics Hives and Itching  . Sulfamethoxazole Swelling  . Macrobid [Nitrofurantoin] Rash    Current  Outpatient Medications  Medication Sig Dispense Refill  . amLODipine (NORVASC) 5 MG tablet Take 1 tablet (5 mg total) by mouth daily. 30 tablet 0  . aspirin EC 81 MG tablet Take by mouth.     . cetirizine (ZYRTEC) 10 MG tablet Take by mouth.    Marland Kitchen. lisinopril (PRINIVIL,ZESTRIL) 10 MG tablet Take 10 mg by mouth daily.    Marland Kitchen. amoxicillin-clavulanate (AUGMENTIN) 875-125 MG tablet Take 1 tablet by mouth every 12 (twelve) hours. (Patient not taking: Reported on 03/25/2018) 14 tablet 0  . atorvastatin (LIPITOR) 20 MG tablet Take by mouth.    . hydrochlorothiazide (HYDRODIURIL) 25 MG tablet Take 1 tablet (25 mg total) by mouth daily. (Patient not taking: Reported on 03/25/2018) 90 tablet 3   No current facility-administered medications for this visit.     Review of Systems  Constitutional: Positive for fatigue.  HENT: HENT negative.  Eyes: Eyes negative.  Respiratory: Respiratory negative.  Cardiovascular: Cardiovascular negative.  GI: Gastrointestinal negative.  Musculoskeletal: Positive for leg pain.  Skin: Skin negative.  Neurological: Neurological negative. Hematologic: Hematologic/lymphatic negative.  Psychiatric: Psychiatric negative.        Objective:  Objective   Vitals:   03/25/18 1250 03/25/18 1257  BP: (!) 96/55 125/67  Pulse: 66 69  Resp: 16   Temp: (!) 97 F (  36.1 C)   TempSrc: Oral   SpO2: 99%   Weight: 143 lb (64.9 kg)   Height: 5' 5.5" (1.664 m)    Body mass index is 23.43 kg/m.  Physical Exam  Constitutional: She appears well-developed.  HENT:  Head: Normocephalic.  Eyes: Pupils are equal, round, and reactive to light.  Neck: Normal range of motion. Neck supple.  Cardiovascular: Normal rate.  Pulses:      Carotid pulses are 2+ on the right side, and 2+ on the left side.      Radial pulses are 2+ on the right side, and 2+ on the left side.       Femoral pulses are 2+ on the right side, and 2+ on the left side.      Popliteal pulses are 2+ on the right side,  and 2+ on the left side.       Dorsalis pedis pulses are 2+ on the right side, and 2+ on the left side.  No widened popliteal arteries  Pulmonary/Chest: Effort normal.  Abdominal: Soft. She exhibits mass.    Data: I reviewed her outside studies of renal artery duplexes which demonstrates a right renal artery peak systolic velocity of 230 in the renal artery to aortic ratio 4.26.  The left side peak systolic velocity 72 with ratio 1.33 Her ankle-brachial index sees are biphasic bilaterally as well as 1 bilaterally.  Her aortoiliac exam demonstrates an aneurysm measuring 3 cm in diameter with iliac stenosis on the right and left consistent with 50%. Carotid duplex demonstrates 40 to 59% stenosis on the right and left side 60 to 79% stenosis by outside parameters with peak systolic velocity 130 and end-diastolic velocity 52. I also reviewed her CT scan from July 2018 which demonstrates a similar sized aneurysm and stenosis of her bilateral renal arteries that is difficult to grade given associated consultation and thrombus.     Assessment/Plan:     62 year old female presents as initial evaluation for significant vascular disease throughout her vascular tree including carotid artery stenosis 60 to 79% on the left, aortic aneurysm measuring 3 cm with concomitant iliac occlusive disease, bilateral renal artery stenosis worse on the right with hypertension that is managed with 2 medications and no renal failure and bilateral lower extremity pain.  She is recently quit smoking and I have congratulated her on this.  She will continue to take 1 baby aspirin daily and I have sent Crestor 10 mg to her pharmacy and instructed her that should she have any muscle pain that she should stop discussed with her primary care physician.  From a vascular standpoint she has no indication for intervention at this time.  We will follow her carotid arteries as well as her aneurysm with duplexes in 1 year.  Should she have  any stroke TIA or amaurosis symptoms she needs to call 911 to be evaluated urgently.  Any new back or abdominal pain should merit evaluation of her aortic aneurysm.  She will otherwise follow-up in 1 year.  I have asked her to avoid ciprofloxacin and other fluoroquinolones and notify her family members of her aneurysm as well.  She does demonstrate very good understanding we will see her in 1 year.     Maeola Harman MD [facility]

## 2018-06-17 DIAGNOSIS — E782 Mixed hyperlipidemia: Secondary | ICD-10-CM | POA: Insufficient documentation

## 2019-01-02 ENCOUNTER — Other Ambulatory Visit: Payer: Self-pay | Admitting: Vascular Surgery

## 2019-02-10 ENCOUNTER — Other Ambulatory Visit: Payer: Self-pay | Admitting: Vascular Surgery

## 2019-03-07 ENCOUNTER — Encounter: Payer: Self-pay | Admitting: *Deleted

## 2019-04-06 IMAGING — CR DG FINGER INDEX 2+V*R*
3 series · 3 of 3 positions shown · non-contrast
Comparison: None.

CLINICAL DATA: Chronic right index finger pain without known
injury.

EXAM:
RIGHT INDEX FINGER 2+V

[x finger pa right]
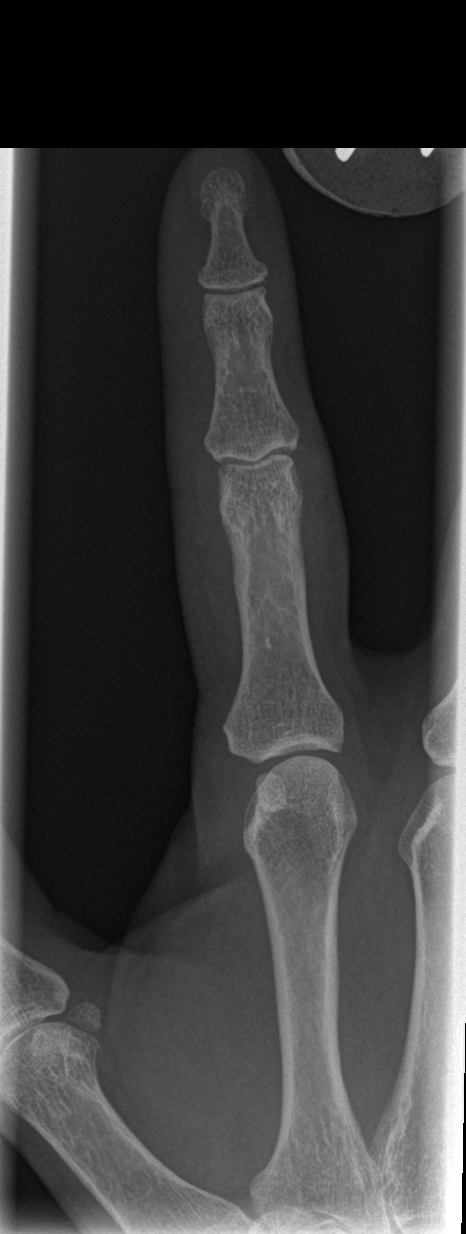

[x finger obl. right]
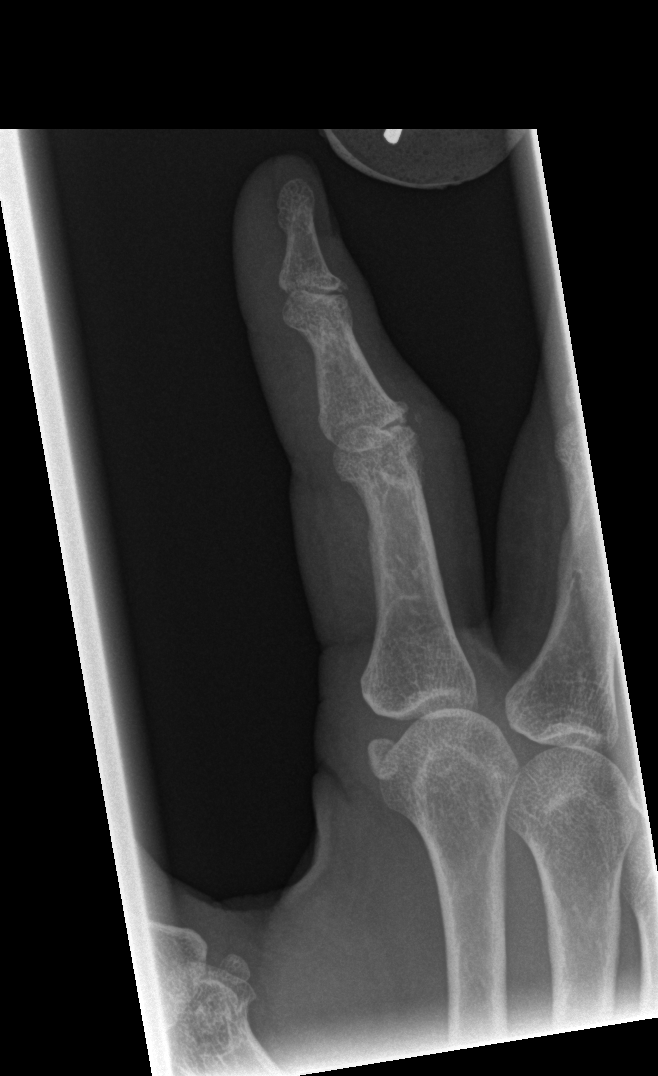

[x finger lateral right]
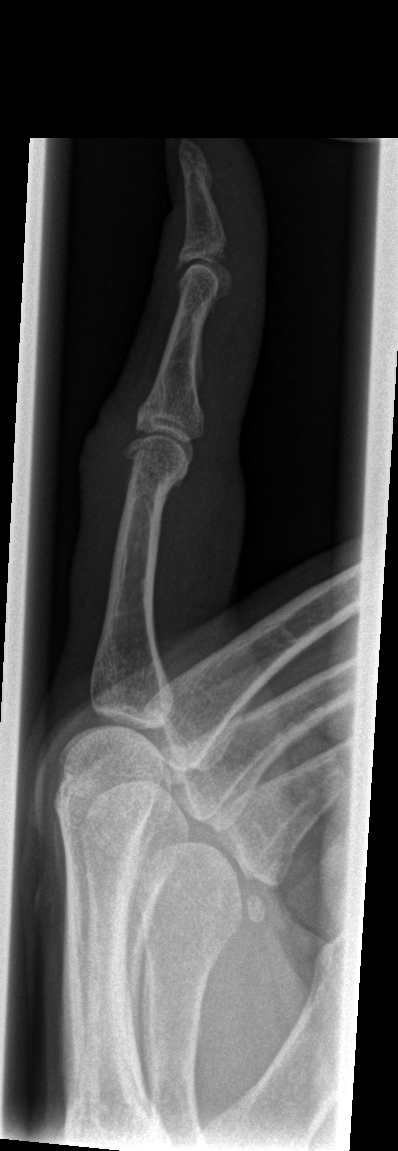

[3 of 3 positions shown; findings below may reference images not displayed]

FINDINGS: Small bone fragment is seen posterior to proximal interphalangeal
joint concerning for fracture of indeterminate age. Joint spaces are
intact. No other abnormality is noted.
IMPRESSION: Small bone fragment seen posterior to proximal interphalangeal joint
concerning for fracture of indeterminate age.

## 2019-04-06 IMAGING — CT CT HEAD W/O CM
3 series · 16 of 47 positions shown, 19 images · non-contrast
Comparison: None.

CLINICAL DATA: Chronic headaches.  Blurry vision for 6 months.

EXAM:
CT HEAD WITHOUT CONTRAST
TECHNIQUE: Contiguous axial images were obtained from the base of the skull
through the vertex without intravenous contrast.

[Series 2: head wo · axial · 0.39mm/px · z∈[-156,-31]mm · 10 of 31 slices shown, 13 images]
[im 3/31  brain]
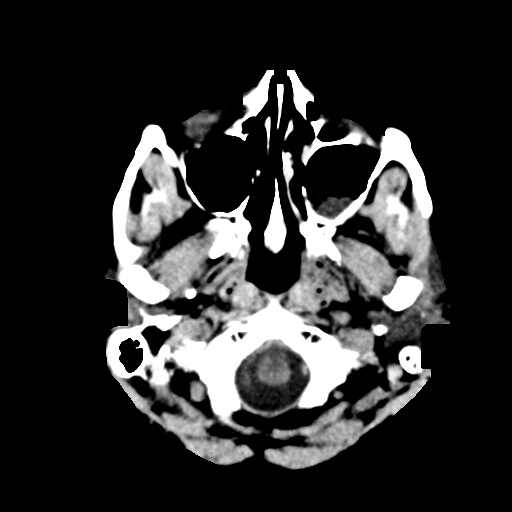
[im 3/31  bone]
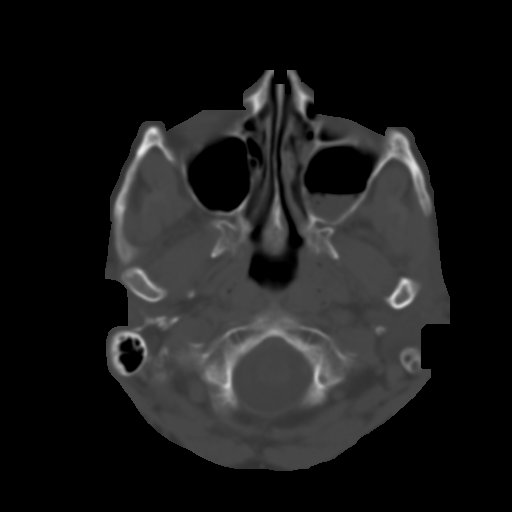
[im 6/31  brain]
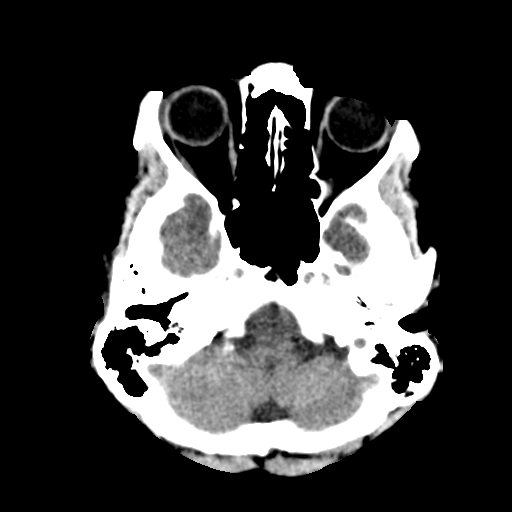
[im 9/31  brain]
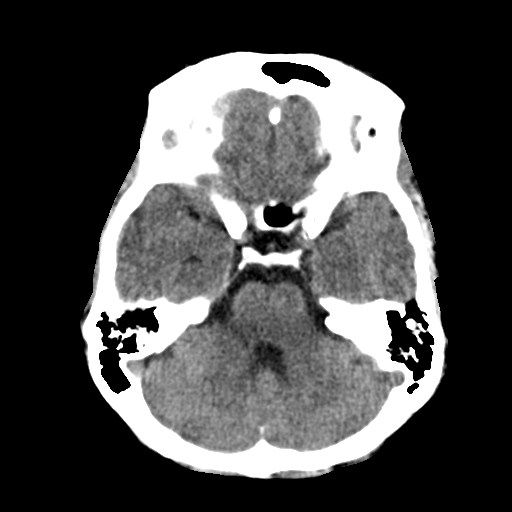
[im 11/31  brain]
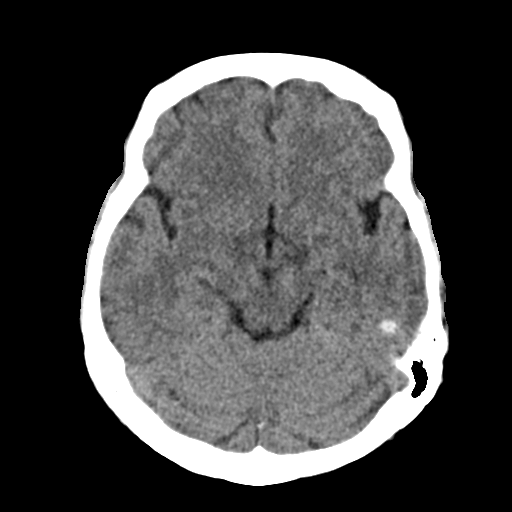
[im 14/31  brain]
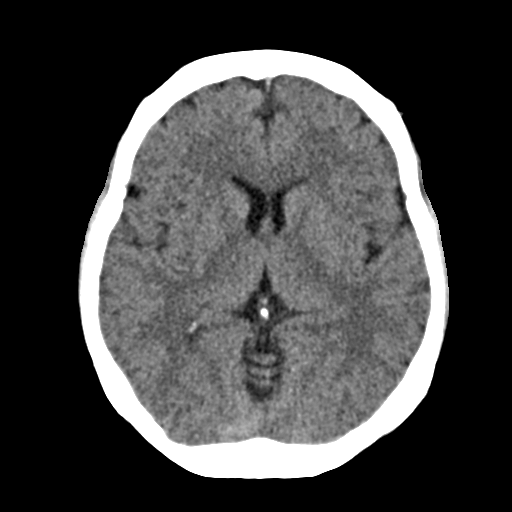
[im 14/31  bone]
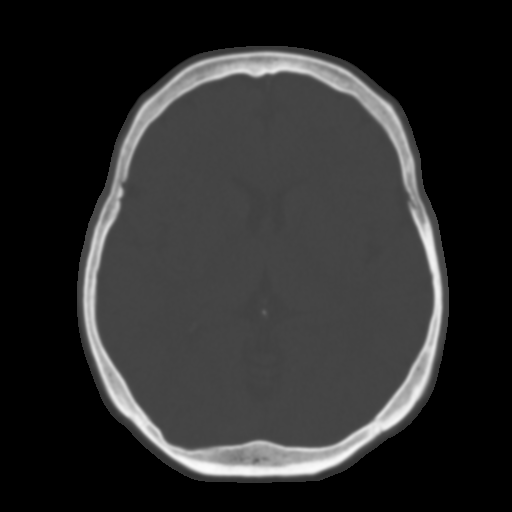
[im 17/31  brain]
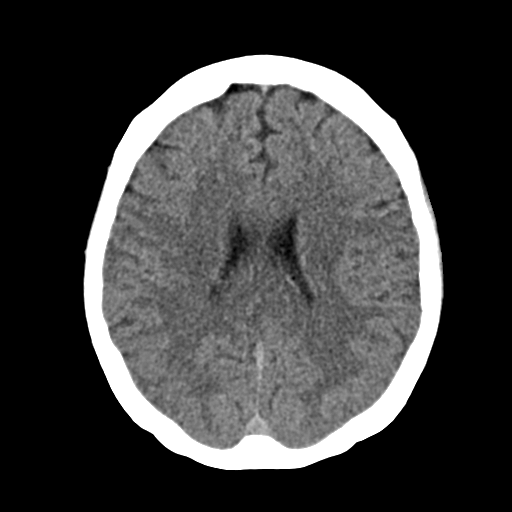
[im 20/31  brain]
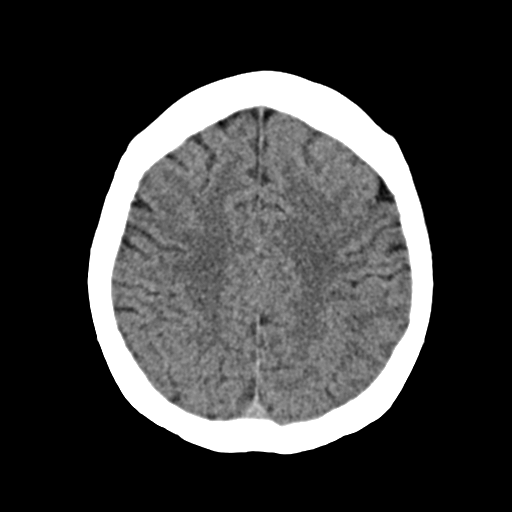
[im 23/31  brain]
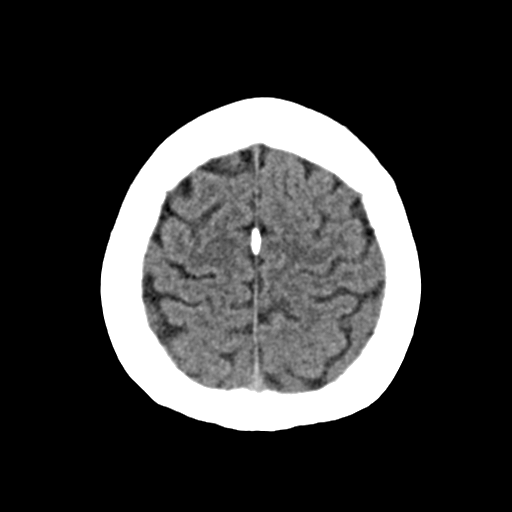
[im 25/31  brain]
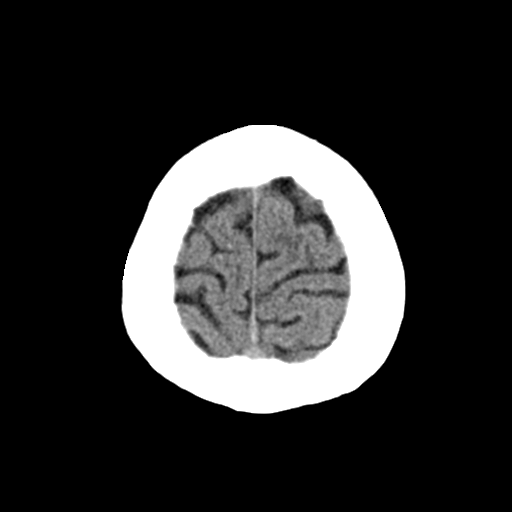
[im 25/31  bone]
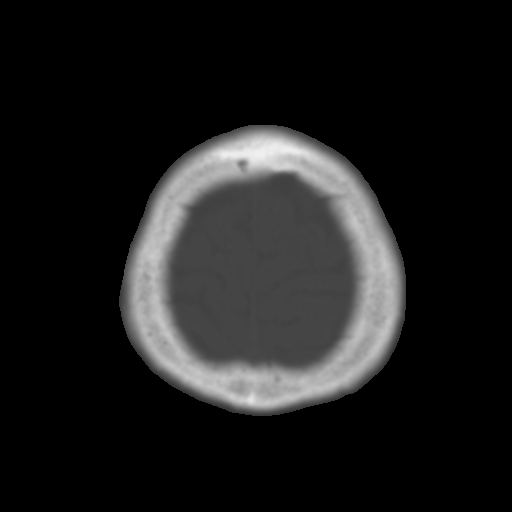
[im 28/31  brain]
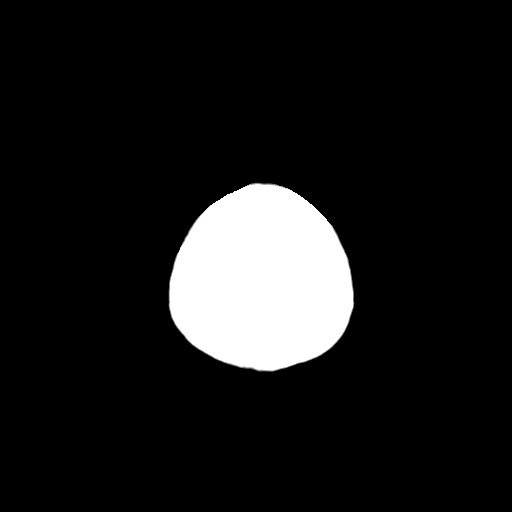

[Series 4: coronal soft · coronal · 0.31mm/px · 3 of 62 slices shown]
[im 21/62  brain]
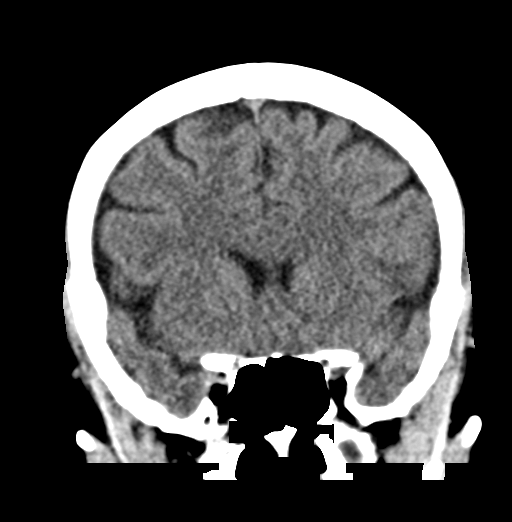
[im 28/62  brain]
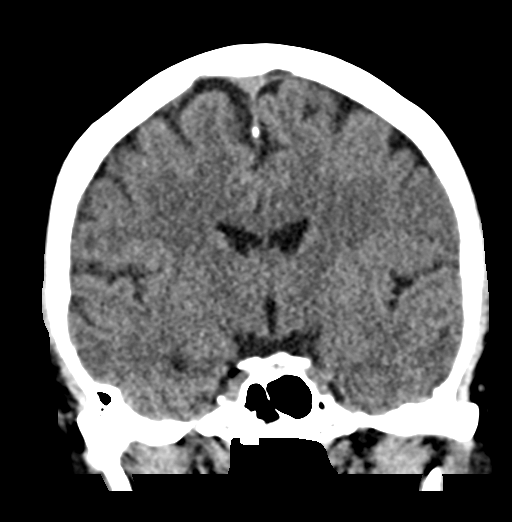
[im 34/62  brain]
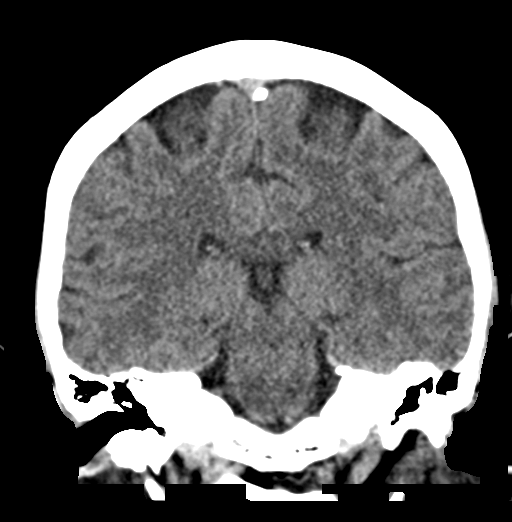

[Series 5: sag soft · sagittal · 0.32mm/px · 3 of 53 slices shown]
[im 18/53  brain]
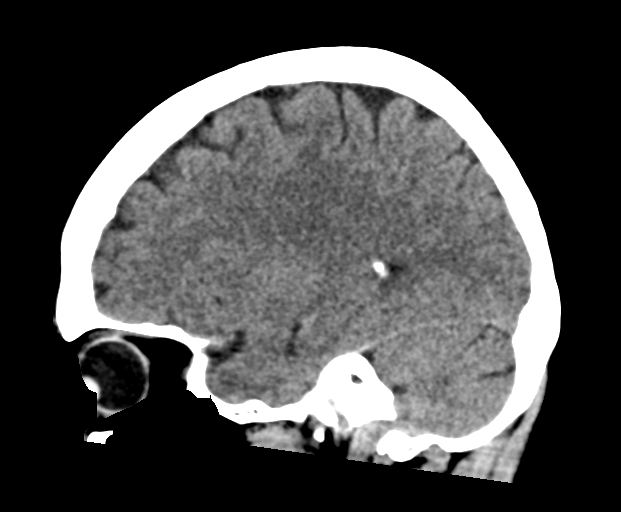
[im 27/53  brain]
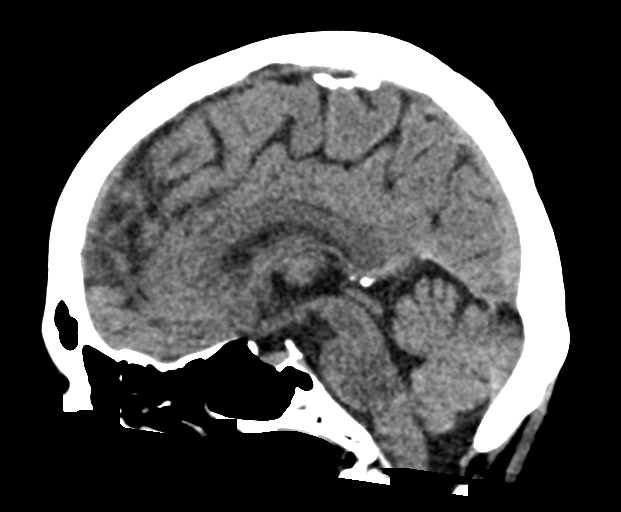
[im 35/53  brain]
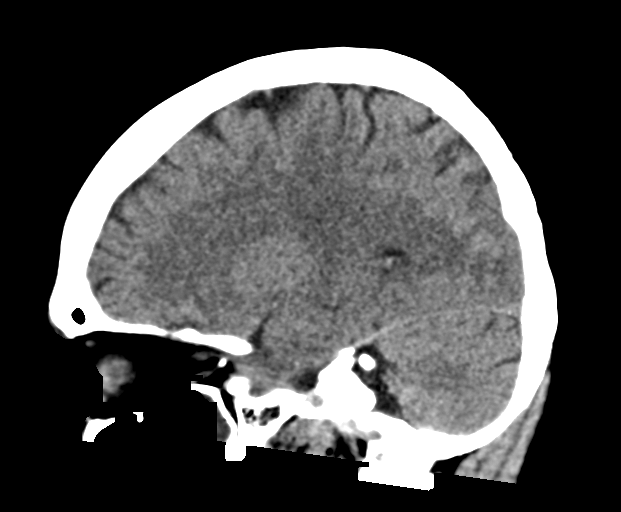

[16 of 47 positions shown; findings below may reference images not displayed]

FINDINGS: Brain: No evidence of acute infarction, hemorrhage, hydrocephalus,
extra-axial collection or mass lesion/mass effect.

Vascular: No hyperdense vessel or unexpected calcification.

Skull: Intact.

Sinuses/Orbits: There is an air fluid level in the left maxillary
sinus.

Other: None.
IMPRESSION: No acute intracranial abnormality.

Air fluid left maxillary sinus compatible with acute sinusitis.

## 2019-04-07 ENCOUNTER — Encounter (HOSPITAL_COMMUNITY): Payer: BC Managed Care – PPO

## 2019-04-07 ENCOUNTER — Other Ambulatory Visit (HOSPITAL_COMMUNITY): Payer: BC Managed Care – PPO

## 2019-04-07 ENCOUNTER — Ambulatory Visit: Payer: BC Managed Care – PPO | Admitting: Vascular Surgery

## 2019-07-25 ENCOUNTER — Other Ambulatory Visit: Payer: Self-pay

## 2019-07-25 DIAGNOSIS — I6523 Occlusion and stenosis of bilateral carotid arteries: Secondary | ICD-10-CM

## 2019-07-25 DIAGNOSIS — I714 Abdominal aortic aneurysm, without rupture, unspecified: Secondary | ICD-10-CM

## 2019-07-28 ENCOUNTER — Other Ambulatory Visit: Payer: Self-pay

## 2019-07-28 ENCOUNTER — Encounter: Payer: Self-pay | Admitting: Vascular Surgery

## 2019-07-28 ENCOUNTER — Ambulatory Visit (HOSPITAL_COMMUNITY)
Admission: RE | Admit: 2019-07-28 | Discharge: 2019-07-28 | Disposition: A | Discharge status: Home / Self Care | Payer: BC Managed Care – PPO | Source: Ambulatory Visit | Attending: Vascular Surgery | Admitting: Vascular Surgery

## 2019-07-28 ENCOUNTER — Ambulatory Visit (INDEPENDENT_AMBULATORY_CARE_PROVIDER_SITE_OTHER)
Admission: RE | Admit: 2019-07-28 | Discharge: 2019-07-28 | Disposition: A | Discharge status: Home / Self Care | Payer: BC Managed Care – PPO | Source: Ambulatory Visit | Attending: Vascular Surgery | Admitting: Vascular Surgery

## 2019-07-28 ENCOUNTER — Ambulatory Visit: Payer: BC Managed Care – PPO | Admitting: Vascular Surgery

## 2019-07-28 VITALS — BP 144/76 | HR 62 | Temp 97.6°F | Resp 20 | Ht 65.0 in | Wt 153.0 lb

## 2019-07-28 DIAGNOSIS — I714 Abdominal aortic aneurysm, without rupture, unspecified: Secondary | ICD-10-CM

## 2019-07-28 DIAGNOSIS — I6523 Occlusion and stenosis of bilateral carotid arteries: Secondary | ICD-10-CM

## 2019-07-28 NOTE — Progress Notes (Signed)
Patient ID: Pamela Garrett, female   DOB: [birth year], 63 y.o.   MRN: [id number]  Reason for Consult: Follow-up   Referred by Lauraine Rinne, MD  Subjective:     HPI:  Pamela Garrett is a 63 y.o. female previous patient of Dr. Denyce Robert whom I saw for concern of carotid artery stenosis, renal artery stenosis, bilateral lower extremity pain and abdominal aortic aneurysm.  She has never had any stroke TIA or amaurosis.  Has 1 cousin who had a brain aneurysm but no abdominal aortic aneurysms in the family.  Was previously a smoker but did quit 1.5 years ago.  Is on 2 medications which controls her blood pressure is never had any renal dysfunction.  Does have bilateral lower extreme pain with walking mostly in her thighs resolves with rest.  She takes Crestor and aspirin daily.  Past Medical History:  Diagnosis Date  . AAA (abdominal aortic aneurysm) ([place])   . Allergy   . Atherosclerosis of renal artery ([place])   . Carotid stenosis   . Hypertension    No family history on file. Past Surgical History:  Procedure Laterality Date  . EYE SURGERY     cataract  . TUBAL LIGATION      Short Social History:  Social History   Tobacco Use  . Smoking status: Former Smoker    Packs/day: 1.00    Types: Cigarettes    Start date: 10/13/1973    Quit date: 09/30/2017    Years since quitting: 1.8  . Smokeless tobacco: Never Used  Substance Use Topics  . Alcohol use: No    Alcohol/week: 0.0 standard drinks    Allergies  Allergen Reactions  . Sulfa Antibiotics Hives and Itching  . Sulfamethoxazole Swelling  . Macrobid [Nitrofurantoin] Rash    Current Outpatient Medications  Medication Sig Dispense Refill  . amLODipine (NORVASC) 5 MG tablet Take 1 tablet (5 mg total) by mouth daily. 30 tablet 0  . aspirin EC 81 MG tablet Take by mouth.     . cetirizine (ZYRTEC) 10 MG tablet Take by mouth.    Marland Kitchen lisinopril (PRINIVIL,ZESTRIL) 10 MG tablet Take 10 mg by mouth daily.    .  rosuvastatin (CRESTOR) 10 MG tablet Take 1 tablet (10 mg total) by mouth daily. 30 tablet 11   No current facility-administered medications for this visit.     Review of Systems  Constitutional:  Constitutional negative. HENT: HENT negative.  Eyes: Eyes negative.  Cardiovascular: Positive for claudication.  GI: Gastrointestinal negative.  Musculoskeletal: Musculoskeletal negative.  Skin: Skin negative.  Neurological: Neurological negative. Hematologic: Hematologic/lymphatic negative.  Psychiatric: Psychiatric negative.        Objective:  Objective   Vitals:   07/28/19 0836  BP: (!) 144/76  Pulse: 62  Resp: 20  Temp: 97.6 F (36.4 C)  SpO2: 100%  Weight: 153 lb (69.4 kg)  Height: 5\' 5"  (1.651 m)   Body mass index is 25.46 kg/m.  Physical Exam Constitutional:      Appearance: Normal appearance.  HENT:     Head: Normocephalic.     Nose: Nose normal.     Mouth/Throat:     Mouth: Mucous membranes are moist.  Eyes:     Extraocular Movements: Extraocular movements intact.     Pupils: Pupils are equal, round, and reactive to light.  Neck:     Musculoskeletal: Normal range of motion and neck supple.     Vascular: No carotid bruit.  Cardiovascular:  Rate and Rhythm: Normal rate and regular rhythm.     Pulses: Normal pulses.  Pulmonary:     Effort: Pulmonary effort is normal.  Abdominal:     General: Abdomen is flat.     Palpations: Abdomen is soft. There is no mass.  Musculoskeletal: Normal range of motion.  Skin:    General: Skin is warm and dry.     Capillary Refill: Capillary refill takes less than 2 seconds.  Neurological:     General: No focal deficit present.     Mental Status: She is alert and oriented to person, place, and time.  Psychiatric:        Mood and Affect: Mood normal.        Thought Content: Thought content normal.        Judgment: Judgment normal.     Data: I have independently interpreted her bilateral carotid duplex which  demonstrates 40 to 59% stenosis right and 1 to 39% stenosis left.  I have independently to read her abdominal aortic duplex which demonstrates greatest diameter 2.4 cm.     Assessment/Plan:    63 year old female with carotid artery stenosis, abdominal aortic aneurysm, bilateral lower extremity pain, renal artery stenosis bilaterally.  I am only following her carotid stenosis and abdominal aneurysm.  Aneurysm now can be followed in 2 years.  Carotid stenosis follow-up in 1 year with repeat duplex.  I discussed with her signs symptoms of stroke TIA amaurosis as well as signs symptoms of abdominal aortic aneurysm rupture which would be low risk at this time.  Blood pressure managed with 2 medications do not need to follow renal artery stenosis at this time.  Has no abdominal bruits either.  Continue Crestor and aspirin follow-up 1 year.      Maeola HarmanBrandon Christopher Brenton Joines MD [facility]

## 2020-07-16 ENCOUNTER — Other Ambulatory Visit: Payer: Self-pay

## 2020-07-16 DIAGNOSIS — I714 Abdominal aortic aneurysm, without rupture, unspecified: Secondary | ICD-10-CM

## 2020-07-16 DIAGNOSIS — I10 Essential (primary) hypertension: Secondary | ICD-10-CM | POA: Insufficient documentation

## 2020-07-16 DIAGNOSIS — I6523 Occlusion and stenosis of bilateral carotid arteries: Secondary | ICD-10-CM

## 2020-07-30 ENCOUNTER — Other Ambulatory Visit: Payer: Self-pay

## 2020-07-30 ENCOUNTER — Ambulatory Visit (HOSPITAL_COMMUNITY)
Admission: RE | Admit: 2020-07-30 | Discharge: 2020-07-30 | Disposition: A | Discharge status: Home / Self Care | Payer: BC Managed Care – PPO | Source: Ambulatory Visit | Attending: Internal Medicine | Admitting: Internal Medicine

## 2020-07-30 ENCOUNTER — Ambulatory Visit (INDEPENDENT_AMBULATORY_CARE_PROVIDER_SITE_OTHER): Payer: BC Managed Care – PPO | Admitting: Physician Assistant

## 2020-07-30 ENCOUNTER — Ambulatory Visit (INDEPENDENT_AMBULATORY_CARE_PROVIDER_SITE_OTHER)
Admission: RE | Admit: 2020-07-30 | Discharge: 2020-07-30 | Disposition: A | Discharge status: Home / Self Care | Payer: BC Managed Care – PPO | Source: Ambulatory Visit | Attending: Internal Medicine | Admitting: Internal Medicine

## 2020-07-30 VITALS — BP 152/74 | HR 60 | Temp 98.3°F | Resp 20 | Ht 65.0 in | Wt 158.7 lb

## 2020-07-30 DIAGNOSIS — I714 Abdominal aortic aneurysm, without rupture, unspecified: Secondary | ICD-10-CM

## 2020-07-30 DIAGNOSIS — I6523 Occlusion and stenosis of bilateral carotid arteries: Secondary | ICD-10-CM

## 2020-07-30 DIAGNOSIS — I739 Peripheral vascular disease, unspecified: Secondary | ICD-10-CM | POA: Diagnosis not present

## 2020-07-30 NOTE — Progress Notes (Signed)
Office Note     CC:  follow up Requesting Provider:  Jackalyn Lombard I, MD  HPI: Pamela Garrett is a 64 y.o. (16-Sep-1956) female who presents for routine surveillance follow up of bilateral carotid stenosis, AAA, renal artery stenosis and peripheral arterial disease. She was last seen in August of 2020 by Dr. Randie Heinz. At the time she was having some claudication symptoms but not disabling. She was on Crestor and aspirin. He felt that her renal artery stenosis did not need to be followed and that her AAA would be evaluated in 2 years. Otherwise she was instructed to follow up in 1 year for her lower extremity claudication and carotid artery stenosis  She is here today for her 1 year follow up. She denies any TIA or stroke like symptoms. She denies any visual changes, facial drooping, slurred speech, dizziness, weakness or numbness of upper or lower extremities. She has over the past week had a few episodes of "zapping" sensations across her scalp. She says they last only a few seconds but they are intense. Scalp is a little sensitive after but otherwise she denies any headaches or other associated symptoms.  Her claudication is essentially unchanged. She has been having bilateral thigh pain that occurs on ambulation and is resolved with rest. She describes it as a muscle spasm like pain. She can walk about 50 yards before this occurs and then she has to stop. She does not describe any calf claudication. She has been having a pain in her left foot recently that very infrequently will wake her up at night and there is a tender "knot" on the plantar aspect of her foot. This is not worsened on ambulation. She just recently over the past month noticed this. She otherwise denies any rest pain and has no tissue loss.  She is not having any back or abdominal pain.  The pt is on a statin for cholesterol management.  The pt is on a daily aspirin.   Other AC: none The pt is on ACE, CCB for hypertension.     The pt is not diabetic.  Tobacco hx:  Former smoker, quit 2018  Past Medical History:  Diagnosis Date  . AAA (abdominal aortic aneurysm) (HCC)   . Allergy   . Atherosclerosis of renal artery ([facility])   . Carotid stenosis   . Hypertension     Past Surgical History:  Procedure Laterality Date  . EYE SURGERY     cataract  . TUBAL LIGATION      Social History   Socioeconomic History  . Marital status: Married    Spouse name: Not on file  . Number of children: Not on file  . Years of education: Not on file  . Highest education level: Not on file  Occupational History  . Occupation: Conservation officer, nature  Tobacco Use  . Smoking status: Former Smoker    Packs/day: 1.00    Types: Cigarettes    Start date: 10/13/1973    Quit date: 09/30/2017    Years since quitting: 2.8  . Smokeless tobacco: Never Used  Vaping Use  . Vaping Use: Never used  Substance and Sexual Activity  . Alcohol use: No    Alcohol/week: 0.0 standard drinks  . Drug use: No  . Sexual activity: Never  Other Topics Concern  . Not on file  Social History Narrative  . Not on file   Social Determinants of Health   Financial Resource Strain:   . Difficulty of Paying Living Expenses:  Not on file  Food Insecurity:   . Worried About Programme researcher, broadcasting/film/videounning Out of Food in the Last Year: Not on file  . Ran Out of Food in the Last Year: Not on file  Transportation Needs:   . Lack of Transportation (Medical): Not on file  . Lack of Transportation (Non-Medical): Not on file  Physical Activity:   . Days of Exercise per Week: Not on file  . Minutes of Exercise per Session: Not on file  Stress:   . Feeling of Stress : Not on file  Social Connections:   . Frequency of Communication with Friends and Family: Not on file  . Frequency of Social Gatherings with Friends and Family: Not on file  . Attends Religious Services: Not on file  . Active Member of Clubs or Organizations: Not on file  . Attends BankerClub or Organization Meetings: Not on file   . Marital Status: Not on file  Intimate Partner Violence:   . Fear of Current or Ex-Partner: Not on file  . Emotionally Abused: Not on file  . Physically Abused: Not on file  . Sexually Abused: Not on file   No family history on file.  Current Outpatient Medications  Medication Sig Dispense Refill  . amLODipine (NORVASC) 5 MG tablet Take 1 tablet (5 mg total) by mouth daily. 30 tablet 0  . aspirin EC 81 MG tablet Take by mouth.     . cetirizine (ZYRTEC) 10 MG tablet Take by mouth.    Marland Kitchen. lisinopril (PRINIVIL,ZESTRIL) 10 MG tablet Take 10 mg by mouth daily.    . rosuvastatin (CRESTOR) 10 MG tablet Take 1 tablet (10 mg total) by mouth daily. 30 tablet 11   No current facility-administered medications for this visit.    Allergies  Allergen Reactions  . Sulfa Antibiotics Hives and Itching  . Sulfamethoxazole Swelling  . Tizanidine     Other reaction(s): Other (See Comments) " lost control of her body with  6 mg tablet"   . Macrobid [Nitrofurantoin] Rash     REVIEW OF SYSTEMS:  [X]  denotes positive finding, [ ]  denotes negative finding Cardiac  Comments:  Chest pain or chest pressure:    Shortness of breath upon exertion:    Short of breath when lying flat:    Irregular heart rhythm:        Vascular    Pain in calf, thigh, or hip brought on by ambulation:    Pain in feet at night that wakes you up from your sleep:     Blood clot in your veins:    Leg swelling:         Pulmonary    Oxygen at home:    Productive cough:     Wheezing:         Neurologic    Sudden weakness in arms or legs:     Sudden numbness in arms or legs:     Sudden onset of difficulty speaking or slurred speech:    Temporary loss of vision in one eye:     Problems with dizziness:         Gastrointestinal    Blood in stool:     Vomited blood:         Genitourinary    Burning when urinating:     Blood in urine:        Psychiatric    Major depression:         Hematologic    Bleeding  problems: X Bruises easily  Problems with blood clotting too easily:        Skin    Rashes or ulcers:        Constitutional    Fever or chills:      PHYSICAL EXAMINATION:  Vitals:   07/30/20 0835 07/30/20 0839  BP: 136/70 (!) 152/74  Pulse: 60   Resp: 20   Temp: 98.3 F (36.8 C)   TempSrc: Temporal   SpO2: 100%   Weight: 158 lb 11.2 oz (72 kg)   Height: 5\' 5"  (1.651 m)     General:  WDWN in NAD; vital signs documented above Gait: Normal HENT: WNL, normocephalic Pulmonary: normal non-labored breathing , without Rales, rhonchi,  wheezing Cardiac: regular HR, without  Murmurs without carotid bruit Abdomen: soft, NT, no masses. Audible abdominal bruit Skin: without rashes, she does have multiple areas of ecchymosis of arms and legs Vascular Exam/Pulses:  Right Left  Radial 2+ (normal) 2+ (normal)  Ulnar 2+ (normal) 2+ (normal)  Femoral 2+ (normal) 2+ (normal)  Popliteal 2+ (normal) 2+ (normal)  DP 2+ (normal) 2+ (normal)  PT 2+ (normal) 2+ (normal)   Extremities: without ischemic changes, without Gangrene , without cellulitis; without open wounds;  Musculoskeletal: no muscle wasting or atrophy  Neurologic: A&O X 3;  No focal weakness or paresthesias are detected Psychiatric:  The pt has Normal affect.   Non-Invasive Vascular Imaging:  07/30/20 Abdominal Aorta Findings:  +-----------+-------+----------+----------+--------+--------+--------+  Location  AP (cm)Trans (cm)PSV (cm/s)WaveformThrombusComments  +-----------+-------+----------+----------+--------+--------+--------+  Proximal  2.27  2.14   52                  +-----------+-------+----------+----------+--------+--------+--------+  Mid    2.91  2.89   54        Present       +-----------+-------+----------+----------+--------+--------+--------+  Distal   2.49  2.69   43                    +-----------+-------+----------+----------+--------+--------+--------+  RT CIA Prox1.0  1.1    149        Present       +-----------+-------+----------+----------+--------+--------+--------+  LT CIA Prox1.2  1.3    89        Present       +-----------+-------+----------+----------+--------+--------+--------+   Visualization of the Right CIA Proximal artery and Left CIA Proximal artery was limited. Abdominal Aorta: There is evidence of abnormal dilatation of the mid Abdominal aorta. The largest aortic measurement is 2.9 cm. The largest  aortic diameter remains essentially unchanged compared to prior exam. Previous diameter measurement was 2.8 cm obtained on 07/28/2019.    VAS Carotid Duplex: Summary:  Right Carotid: Velocities in the right ICA are consistent with a 1-39% stenosis.   Left Carotid: Velocities in the left ICA are consistent with a 1-39% stenosis.   Vertebrals: Right vertebral artery demonstrates antegrade flow. Left vertebral artery demonstrates retrograde flow.  Subclavians: Normal flow hemodynamics were seen in bilateral subclavian arteries.    ASSESSMENT/PLAN:: 64 y.o. female here for follow up for her bilateral carotid stenosis, AAA and peripheral arterial disease. Overall her symptoms are unchanged. She does continue to have bilateral thigh claudication. She did not have ABI/TBI today. Clinically she has palpable pulses bilaterally and legs are well perfused and warm. Her AAA duplex is essentially unchanged from last years study. There was some thrombus noted in the mid abdominal aorta as well as bilateral common iliac arteries which was not previously noted. She does also clinically have abdominal bruit. I have  discussed with her and will obtain ABI and aortoiliac duplex. I will have her follow up for this in 3 months. She is not otherwise having any symptoms associated with her carotid stenosis. Her duplex is stable. She will  have another duplex in 1 year  Graceann Congress, [place] [facility] [phone]  Clinic MD:   Dr. Chestine Spore

## 2020-08-01 ENCOUNTER — Other Ambulatory Visit: Payer: Self-pay | Admitting: *Deleted

## 2020-08-01 DIAGNOSIS — I714 Abdominal aortic aneurysm, without rupture, unspecified: Secondary | ICD-10-CM

## 2020-08-01 DIAGNOSIS — I739 Peripheral vascular disease, unspecified: Secondary | ICD-10-CM

## 2020-10-29 ENCOUNTER — Encounter (HOSPITAL_COMMUNITY): Payer: BC Managed Care – PPO

## 2020-10-29 ENCOUNTER — Ambulatory Visit: Payer: BC Managed Care – PPO

## 2021-01-24 ENCOUNTER — Other Ambulatory Visit: Payer: Self-pay | Admitting: Physician Assistant

## 2021-01-24 DIAGNOSIS — I714 Abdominal aortic aneurysm, without rupture, unspecified: Secondary | ICD-10-CM

## 2021-01-24 DIAGNOSIS — I739 Peripheral vascular disease, unspecified: Secondary | ICD-10-CM

## 2021-07-25 ENCOUNTER — Other Ambulatory Visit: Payer: Self-pay

## 2021-07-25 DIAGNOSIS — I6523 Occlusion and stenosis of bilateral carotid arteries: Secondary | ICD-10-CM

## 2021-08-05 ENCOUNTER — Ambulatory Visit (HOSPITAL_COMMUNITY): Payer: BC Managed Care – PPO

## 2021-08-05 ENCOUNTER — Ambulatory Visit: Payer: Self-pay

## 2023-12-15 ENCOUNTER — Ambulatory Visit: Payer: BC Managed Care – PPO | Admitting: Internal Medicine
